# Patient Record
Sex: Female | Born: 1975 | Race: White | Hispanic: No | Marital: Married | Smoking: Former smoker
Health system: Southern US, Community
[De-identification: ages and names within clinical notes are randomized; demographics above are authoritative.]

## PROBLEM LIST (undated history)

## (undated) DIAGNOSIS — N189 Chronic kidney disease, unspecified: Secondary | ICD-10-CM

## (undated) DIAGNOSIS — T8859XA Other complications of anesthesia, initial encounter: Secondary | ICD-10-CM

## (undated) DIAGNOSIS — R51 Headache: Secondary | ICD-10-CM

## (undated) DIAGNOSIS — T4145XA Adverse effect of unspecified anesthetic, initial encounter: Secondary | ICD-10-CM

## (undated) DIAGNOSIS — R519 Headache, unspecified: Secondary | ICD-10-CM

## (undated) DIAGNOSIS — D649 Anemia, unspecified: Secondary | ICD-10-CM

## (undated) DIAGNOSIS — I499 Cardiac arrhythmia, unspecified: Secondary | ICD-10-CM

## (undated) DIAGNOSIS — Z8489 Family history of other specified conditions: Secondary | ICD-10-CM

## (undated) DIAGNOSIS — E039 Hypothyroidism, unspecified: Secondary | ICD-10-CM

## (undated) HISTORY — PX: COLONOSCOPY: SHX174

## (undated) HISTORY — PX: NASAL FRACTURE SURGERY: SHX718

## (undated) HISTORY — PX: DILATION AND CURETTAGE OF UTERUS: SHX78

## (undated) HISTORY — PX: TUBAL LIGATION: SHX77

## (undated) HISTORY — PX: EYE SURGERY: SHX253

## (undated) HISTORY — PX: ABDOMINAL HYSTERECTOMY: SHX81

---

## 2006-09-10 DIAGNOSIS — N189 Chronic kidney disease, unspecified: Secondary | ICD-10-CM

## 2006-09-10 HISTORY — DX: Chronic kidney disease, unspecified: N18.9

## 2007-05-22 ENCOUNTER — Inpatient Hospital Stay: Payer: Self-pay | Admitting: Obstetrics and Gynecology

## 2007-09-10 ENCOUNTER — Observation Stay: Payer: Self-pay

## 2007-09-10 ENCOUNTER — Observation Stay: Payer: Self-pay | Admitting: Unknown Physician Specialty

## 2007-09-13 ENCOUNTER — Inpatient Hospital Stay: Payer: Self-pay

## 2008-12-05 ENCOUNTER — Emergency Department: Payer: Self-pay | Admitting: Emergency Medicine

## 2009-04-04 ENCOUNTER — Ambulatory Visit: Payer: Self-pay | Admitting: Gastroenterology

## 2010-05-03 ENCOUNTER — Emergency Department: Payer: Self-pay | Admitting: Internal Medicine

## 2010-06-28 ENCOUNTER — Ambulatory Visit: Payer: Self-pay

## 2013-07-08 ENCOUNTER — Ambulatory Visit: Payer: Self-pay | Admitting: Obstetrics and Gynecology

## 2013-07-08 LAB — CBC
HCT: 37.4 % (ref 35.0–47.0)
MCH: 29.7 pg (ref 26.0–34.0)
MCV: 87 fL (ref 80–100)
Platelet: 240 10*3/uL (ref 150–440)
RBC: 4.31 10*6/uL (ref 3.80–5.20)
WBC: 7.8 10*3/uL (ref 3.6–11.0)

## 2013-07-14 ENCOUNTER — Ambulatory Visit: Payer: Self-pay | Admitting: Obstetrics and Gynecology

## 2013-07-15 LAB — PATHOLOGY REPORT

## 2013-11-02 ENCOUNTER — Encounter: Payer: Self-pay | Admitting: Obstetrics and Gynecology

## 2014-03-05 ENCOUNTER — Observation Stay: Payer: Self-pay | Admitting: Obstetrics and Gynecology

## 2014-03-05 LAB — PROTEIN / CREATININE RATIO, URINE
Creatinine, Urine: 45.3 mg/dL (ref 30.0–125.0)
Protein, Random Urine: 8 mg/dL (ref 0–12)
Protein/Creat. Ratio: 177 mg/gCREAT (ref 0–200)

## 2014-03-31 ENCOUNTER — Observation Stay: Payer: Self-pay | Admitting: Obstetrics and Gynecology

## 2014-03-31 LAB — URINALYSIS, COMPLETE
BILIRUBIN, UR: NEGATIVE
Blood: NEGATIVE
Glucose,UR: NEGATIVE mg/dL (ref 0–75)
LEUKOCYTE ESTERASE: NEGATIVE
Nitrite: NEGATIVE
PROTEIN: NEGATIVE
Ph: 6 (ref 4.5–8.0)
RBC,UR: 1 /HPF (ref 0–5)
SPECIFIC GRAVITY: 1.011 (ref 1.003–1.030)
Squamous Epithelial: 1
WBC UR: <1 /HPF (ref 0–5)

## 2014-03-31 LAB — FETAL FIBRONECTIN
Appearance: NORMAL
Fetal Fibronectin: NEGATIVE

## 2014-05-15 ENCOUNTER — Observation Stay: Payer: Self-pay | Admitting: Obstetrics and Gynecology

## 2014-06-03 ENCOUNTER — Inpatient Hospital Stay: Payer: Self-pay | Admitting: Obstetrics and Gynecology

## 2014-06-03 LAB — CBC WITH DIFFERENTIAL/PLATELET
BASOS ABS: 0 10*3/uL (ref 0.0–0.1)
BASOS PCT: 0.4 %
Eosinophil #: 0.1 10*3/uL (ref 0.0–0.7)
Eosinophil %: 0.5 %
HCT: 33.8 % — ABNORMAL LOW (ref 35.0–47.0)
HGB: 11.2 g/dL — AB (ref 12.0–16.0)
LYMPHS ABS: 2.4 10*3/uL (ref 1.0–3.6)
Lymphocyte %: 20.6 %
MCH: 27.1 pg (ref 26.0–34.0)
MCHC: 33.1 g/dL (ref 32.0–36.0)
MCV: 82 fL (ref 80–100)
MONO ABS: 0.8 x10 3/mm (ref 0.2–0.9)
MONOS PCT: 6.5 %
NEUTROS PCT: 72 %
Neutrophil #: 8.5 10*3/uL — ABNORMAL HIGH (ref 1.4–6.5)
PLATELETS: 181 10*3/uL (ref 150–440)
RBC: 4.12 10*6/uL (ref 3.80–5.20)
RDW: 15.1 % — ABNORMAL HIGH (ref 11.5–14.5)
WBC: 11.8 10*3/uL — ABNORMAL HIGH (ref 3.6–11.0)

## 2014-06-04 LAB — PATHOLOGY REPORT

## 2014-06-04 LAB — HEMATOCRIT: HCT: 27.3 % — AB (ref 35.0–47.0)

## 2014-12-31 NOTE — Op Note (Signed)
PATIENT NAMGarret Reddish:  Little, Lauren MR#:  [id number] DATE OF BIRTH:  [birth year]  DATE OF PROCEDURE:  07/14/2013  PREOPERATIVE DIAGNOSES: Missed abortion at 6 weeks 4 days' gestation.   POSTOPERATIVE DIAGNOSES: Missed abortion at 6 weeks 4 days' gestation.   PROCEDURE: Suction dilatation and curettage.   ANESTHESIA USED: General.   PRIMARY SURGEON: Florina OuAndreas M. Bonney AidStaebler, MD   ESTIMATED BLOOD LOSS: 100 mL.   OPERATIVE FLUIDS: 500 mL.   URINE OUTPUT: 100 mL.   COMPLICATIONS: None.   FINDINGS: Moderate amount of tissue. Cervix already dilated to fingertip. Good uterine cry noted on sharp curettage. Following the procedure, cervix and tenaculum sites hemostatic.   SPECIMENS REMOVED: Products of conception.   PATIENT CONDITION FOLLOWING PROCEDURE: Stable.   PROCEDURE IN DETAIL: Risks, benefits, and alternatives of the procedure were discussed with the patient prior to proceeding to the operating room. The patient was taken to the operating room where she was placed under general anesthesia using LMA airway. The patient was then positioned in the dorsal lithotomy position using Allen stirrups, prepped and draped in the usual sterile fashion. A timeout was performed.   Attention was turned to the patient's pelvis. The bladder was straight cathed with a red rubber catheter for 100 mL of clear urine. An operative speculum was placed to allow visualization of the cervix. The anterior lip of the cervix was grasped with a single-tooth tenaculum. The cervix was sequentially dilated using Hegar dilators, and a size 8 suction curette was then introduced into the uterus which was sounded to 10 cm. Several passes with the suction curette yielded a moderate amount of tissue. Sharp curettage at the conclusion of the case revealed good uterine cry throughout. A final pass with the suction curette was undertaken. The single-tooth tenaculum was removed. The cervix and tenaculum sites were inspected and noted to be  hemostatic. Sponge, needle, and instrument counts were correct x2. The patient tolerated the procedure well and was taken to the recovery room in stable condition.    ____________________________ Florina OuAndreas M. Bonney AidStaebler, MD ams:np D: 07/14/2013 14:55:28 ET T: 07/14/2013 15:23:25 ET JOB#: [id number]  cc: Florina OuAndreas M. Bonney AidStaebler, MD, <Dictator> Carmel SacramentoANDREAS Cathrine MusterM Kaly Mcquary MD ELECTRONICALLY SIGNED 07/22/2013 8:57

## 2015-01-01 NOTE — Consult Note (Signed)
Referral Information:  Reason for Referral 39yo G5P1031 at [redacted]w[redacted]d by US performed at [facility] (per patient report) not c/w LMP presents for consult due to her history of 2 first trimester miscarriages, MTHFR compound heterozygosity, hypothyroidism and advanced maternal age.   Referring Physician [facility]   Prenatal Hx Patient had 1 period which was NOT typical in December, 1 month after a D&C for a missed abortion.  She found out she was pregnant in January and had an ultrasound last week which confirmed viability.  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  Other labs were normal.   Past Obstetrical Hx 1.  1996 elective TAB at 9-10 weeks 2.  2009 NVD (vacuum assist with episiotomy and 3rd degree laceration) of 8#1oz female; pregnancy complicated by kidney stones 3.  May 2014 first trimester SAB (no D&C) 4.  November 2014 first trimester SAB (D&C) 5.  2015 current   Home Medications: Medication Instructions Status  levothyroxine 88 mcg (0.088 mg) oral capsule 1 cap(s) orally once a day Active  Diclegis 10 mg-10 mg oral delayed release tablet 1 tab(s) orally once a day (at bedtime), As Needed - for Nausea, Vomiting Active  Prenatal Multivitamins Prenatal Multivitamins oral tablet 1 tab(s) orally once a day Active  folic acid 3 milligram(s) orally once a day Active  Tylenol 500 mg oral tablet 2 tab(s) orally every 6 hours, As Needed - for Pain Active   Allergies:   Imitrex: SOB  Cefdinir: Hives  Vital Signs/Notes:  Nursing Vital Signs: **Vital Signs.:   23-Feb-15 09:59  Vital Signs Type Routine  Temperature Temperature (F) 96.1  Celsius 35.6  Temperature Source oral  Pulse Pulse 69  Respirations Respirations 18  Systolic BP Systolic BP 110  Diastolic BP (mmHg) Diastolic BP (mmHg)  54  Mean BP 72  Pulse Ox % Pulse Ox % 100  Pulse Ox Activity Level  At rest  Oxygen Delivery Room Air/ 21 %   Perinatal Consult:  LMP 22-Aug-2013   PGyn Hx History of irregular menses; one abnormal PAP (no therapy); no STDs   Past Medical History cont'd 1.  Hypothyroidism (had radioactive iodine therapy in 2003 for hyperthyroidism) Had a recent increase of her levothyroxine to 88 mcg daily due to an elevation in her TSH.  She has an endocrinologist at [facility] (Dr. Tedd Sias) who follows her for this. 2.  Menstrual migraines 3.  History of postpartum depression after the delivery of her daughter - was on Zoloft for 6 months.  Describes that delivery and the recovery as very traumatic. 4.  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  AMA 2.  Hypothyroidism 3.  2 first trimester losses and  compound heterozygous for the MTHFR mutation 4.  History of traumatic delivery and postpartum depression   Recommendations 1.  AMA.  We discussed the availability of genetic counseling and briefly reviewed aneuploidy screening and testing options both in the first and second trimester.  She states that results, even if abnormal, would not affect her decision to continue the pregnancy although she was considering cell free fetal DNA as she works at [employer] and that is available through them.  She ultimately elected to talk further about this with her husband. 2.  Hypothyroidism - currently being treated and monitored by endocrine.  Recommend third trimester ultrasound for growth. 3.  Compound heterozygous for MTHFR mutation - not known to be associated with pregnancy loss but can affect metabolism of homocysteine.  The only recommendation is for an additional 1 mg of folic acid.  We discussed that she screened negative for the most common thrombophilias that can be associated with 1st trimester losses (LAC, ACL, beta-2-microglobulin).  We discussed the option of completing the thrombophilia panel (for antithrombin III gene and the prothrombin gene mutation) but that this was unlikely to provide additional information in the absence of a personal history of clots.  Should she have another miscarriage, we discussed sending tissue for karyotype, parental karyotype and SIS to complete the pregnancy loss evaluation.  The patient does not meet criteria for ASA therapy according to the most current [place] Preventative Services Taskforce recommendations. 4.  History of difficult delivery/postpartum recovery and postpartum depression.  We discussed that she is at increased risk for recurrent postpartum depression but also discussed that the difficult delivery along with being a first time mother, may have compounded her risk for depression.  We discussed signs and symptoms and she is aware of the availability of treatment  should she feel it to be necessary.  We would be more than happy to see the patient back in consult or for ultrasound or genetic counseling if desired.    Total Time Spent with Patient 45 minutes   >50% of visit spent in couseling/coordination of care yes   Office Use Only [id number]  Level 3 (40min) NEW office consult detailed   Coding Description: MATERNAL CONDITIONS/HISTORY INDICATION(S).   Adv. Maternal Age - multigravida.   Coagulation defects and/or Pt on heparin/coumadin/lovenox.   Thyroid dysfunction in pregnancy.  Electronic Signatures: Kirby FunkEllestad, Haylyn Halberg (MD)  (Signed 23-Feb-15 12:29)  Authored: Referral, Home Medications, Allergies, Vital Signs/Notes, Consult, Exam, Lab/Radiology Notes, Impression, Billing, Coding Description   Last Updated: 23-Feb-15 12:29 by Kirby FunkEllestad, Klever Twyford (MD)

## 2015-01-01 NOTE — Op Note (Signed)
PATIENT NAMGarret Little:  Stager, Annastyn MR#:  [id number] DATE OF BIRTH:  1976-05-05  DATE OF PROCEDURE:  06/03/2014  PREOPERATIVE DIAGNOSES: Term intrauterine pregnancy, prior history of birth trauma desire for permanent sterility.  POSTOPERATIVE DIAGNOSES: Term intrauterine pregnancy, prior history of birth trauma desire for permanent sterility.  PROCEDURES: Low transverse cesarean section, bilateral tubal ligation, placement of On-Q pain pump.   SURGEON: Dierdre Searles. Paul Ovie Eastep, MD  ASSISTANT: Farrel ConnersGutierrez, Colleen CNM  ANESTHESIA: Spinal.   ESTIMATED BLOOD LOSS: 5 mL.   COMPLICATIONS: None.   FINDINGS: Normal tubes, ovaries and uterus. Viable female infant weighing 9 lbs. and 1 oz with Apgar scores 8, 9 at 5 minutes respectively was delivered with disposition to the recovery room in stable condition.   TECHNIQUE: The patient is prepped and draped in the usual sterile fashion. After adequate anesthesia is obtained in the supine position on the operating room table. A scalpel was used to create a low-transverse skin incision down to the level of the rectus fascia which dissected bilaterally using Mayo scissors. Rectus muscles were then separated in the midline and dissected away from the fascia and then the bladder is inferiorly dissected and retracted. A scalpel was used to create a low-transverse hysterotomy incision that was then extended by blunt dissection. Amniotomy reveals clear fluid. The infant's head is grasped and delivered without the use of a suction device. The oropharynx is suctioned and the remaining portion of the infant is delivered without complication.   Cord blood was obtained the placenta is manually extracted. The uterus is externalized and cleansed of all membranes and debris using a moist sponge. The hysterotomy incision is closed with a running 1 Vicryl suture in a locking fashion followed by a secondary imbricated with excellent hemostasis noted.   The right fallopian tube is grasped and a  portion of the Babcock clamp and a loop was tied with 2 Vicryl sutures, excised and cauterized. The left fallopian tube was grasped in midportion with Babcock clamp and a loop is tied with  2 Vicryl sutures, excised and cauterized. The uterus was then placed back into the intraabdominal cavity with the pericardium was irrigated using warm saline. Re-examination of the incisions tubal site and hysterotomy site reveals excellent hemostasis.   The peritoneum was closed with 0 Vicryl suture. The trocars are placed through the abdomen into the subfascial space and are used to thread Silver soaker catheters associated with the On-Q pain pump into place. The rectus fascia is closed with 0 Maxon suture with careful placement not to incorporate these catheters. The subcutaneous layer was irrigated and hemostasis is assured using electrocautery. The skin is closed with a 4-0 Vicryl suture in a subcuticular fashion followed placement of Dermabond. The On-Q pain pump catheters are flushed with 5 mL each of bupivacaine and  then stabilized in place with Dermabond, Steri-Strips and a Tegaderm bandage. The patient went to the recovery room in stable condition. All sponge, instrument and needle counts were correct.     ____________________________ R. Annamarie MajorPaul Felipa Laroche, MD rph:JT D: 06/03/2014 02:56:00 ET T: 06/03/2014 03:30:25 ET JOB#: [id number]  cc: Dierdre Searles. Paul Brooklin Rieger, MD, <Dictator> Nadara MustardOBERT P Twyla Dais MD ELECTRONICALLY SIGNED 06/04/2014 10:40

## 2015-01-01 NOTE — Op Note (Signed)
PATIENT NAMGarret Little:  Emmitt, Landa MR#:  [id number] DATE OF BIRTH:  December 07, 1975  DATE OF PROCEDURE:  06/03/2014  PREOPERATIVE DIAGNOSES: Term intrauterine pregnancy, prior history of birth trauma desire for permanent sterility.  POSTOPERATIVE DIAGNOSES: Term intrauterine pregnancy, prior history of birth trauma desire for permanent sterility.  PROCEDURES: Low transverse cesarean section, bilateral tubal ligation, placement of On-Q pain pump.   SURGEON: (Dictation Anomaly)  ASSISTANT: (Dictation Anomaly)   ANESTHESIA: Spinal.   ESTIMATED BLOOD LOSS: 5 mL.   COMPLICATIONS: None.   FINDINGS: Normal tubes, ovaries and uterus. Viable female infant weighing 9 lbs. and 1 oz with Apgar scores 8, 9 at 5 minutes respectively was delivered with disposition to the recovery room in stable condition.   TECHNIQUE: The patient is prepped and draped in the usual sterile fashion. After adequate anesthesia is obtained in the supine position on the operating room table. A scalpel was used to create a low-transverse skin incision down to the level of the rectus fascia which dissected bilaterally using Mayo scissors. Rectus muscles were then separated in the midline and dissected away from the fascia and then the bladder is inferiorly dissected and retracted. A scalpel was used to create a low-transverse hysterotomy incision that was then extended by blunt dissection. Amniotomy reveals clear fluid. The infant's head is grasped and delivered without the use of a suction device. The oropharynx is suctioned and the remaining portion of the infant is delivered without complication.   Cord blood was obtained the placenta is manually extracted. The uterus is externalized and cleansed of all membranes and debris using a moist sponge. The hysterotomy incision is closed with a running 1 Vicryl suture in a locking fashion followed by a secondary imbricated with excellent hemostasis noted.   The right fallopian tube is grasped and a  portion of the Babcock clamp and a loop was tied with 2 Vicryl sutures, excised and cauterized. The left fallopian tube was grasped in midportion with Babcock clamp and a loop is tied with  2 Vicryl sutures, excised and cauterized. The uterus was then placed back into the intraabdominal cavity with the pericardium was irrigated using warm saline. Re-examination of the incisions tubal site and hysterotomy site reveals excellent hemostasis.   The peritoneum was closed with 0 Vicryl suture. The trocars are placed through the abdomen into the subfascial space and are used to thread Silver soaker catheters associated with the On-Q pain pump into place. The rectus fascia is closed with 0 Maxon suture with careful placement not to incorporate these catheters. The subcutaneous layer was irrigated and hemostasis is assured using electrocautery. The skin is closed with a 4-0 Vicryl suture in a subcuticular fashion followed placement of Dermabond. The On-Q pain pump catheters are flushed with 5 mL each of bupivacaine and  then stabilized in place with Dermabond, Steri-Strips and a Tegaderm bandage. The patient went to the recovery room in stable condition. All sponge, instrument and (Dictation Anomaly) counts were correct.     ____________________________ Lenard GallowayPrasad P. Steward RosManne, MD ppm:JT D: 06/03/2014 02:56:27 ET T: 06/03/2014 03:30:25 ET JOB#: [id number]  cc: Elpidio AnisPrasad P. Steward RosManne, MD, <Dictator>

## 2015-01-18 NOTE — H&P (Signed)
L&D Evaluation:  History:  HPI 39 yo [id number] with EDC=06/06/2014 by 6week ultrasound presents at 3819w4d weeks gestation with c/o strong frrequent contractions since . She last ate at 1930-2000 last night. No LOF, VB or decreased FM. She is scheduled for a C-section on 9/25 for hx of traumatic delivery with first baby sustaining a third degree laceration. She also desires a TL and has been counseled on permanance and failure rate.  [facility] has been complicated by AMA, hypothyroidism ([facility]), history of 3rd degree perineal laceration and several episodes of tachycardia/blurred vision. Pt was evaluated by Cardiology, per pt holter monitor and echo were normal. [facility] d/t h/o 3rd degree- anal US normal, Rec's in chart (avoid post-dates IOL, episiotomy and operative delivery)   Presents with contractions   Patient's Medical History 1) hypothyroidism, 2) migraine, 3) PCOS 4) PP Depression   Patient's Surgical History other  1) colonoscopy, 2) D&C for missed AB, 3) Tear duct surgery at 6 mos of age   Medications Pre [place]atal Vitamins  Tylenol (Acetaminophen)  levothyroxine 100mcg po daily (was advised to take 200 mcg on 9/23 as TSH slightly elevated), and Ambien prn   Allergies 1) imitrex - chest/shoulder/head tingling, 2) cefnidir - hives   Social History none   Family History Non-Contributory   ROS:  ROS see HPI   Exam:  Vital Signs stable  138/72   General breathing with contractions   Mental Status clear   Chest clear   Heart normal sinus rhythm, no murmur/gallop/rubs   Abdomen gravid, tender with contractions   Estimated Fetal Weight EFW=8+   Fetal Position cephalic   Edema trace edema   Reflexes +1-+2   Pelvic no external lesions, 4/90%/-1   Mebranes Intact   FHT normal rate with no decels, 130s baseline with accels to 160s   FHT Description Cat 1   Ucx regular, q1-4 min apart    Skin dry   Impression:  Impression IUP at 39 4/7 weeks in early labor. Desires CS for hx of traumatic delivery with third degree laceration.  Desires sterilization   Plan:  Plan Prepare for C-section and TL. Dr Tiburcio PeaHarris consulted -CS called.   Comments Discussed with patient risks of C-section including risks of bleeding, infection, injury to other abdominal structures like bowel, bladder or other blood vessels and the risks of anesthesia. She has also been counseled on the permanance of a tubal ligation and risks of failure of 1/300. Blood transfusion consent also obtained-reviewed risks of infection and allergic rxn.   Electronic Signatures: Trinna BalloonGutierrez, Angle Karel L (CNM)  (Signed 24-Sep-15 01:16)  Authored: L&D Evaluation   Last Updated: 24-Sep-15 01:16 by Trinna BalloonGutierrez, Nevaeh Casillas L (CNM)

## 2015-01-18 NOTE — H&P (Signed)
L&D Evaluation:  History Expanded:  HPI 39 yo G4P1021 at 2854w3d gestational age by 6 weeks ultrasound, presents with c/o low abdominal pain since yesterday, approximately every 6-7 minutes. She left work early yesterday d/t not feeling well. She reports good po hydration. No LOF, VB or decreased FM.   [facility] complicated by AMA, hypothyroidism ([facility] Lauren [facility]), history of 3rd degree perineal laceration and several episodes of tachycardia/blurred vision. Pt was evaluated by Cardiology, per pt holter monitor and echo were normal. [facility]/t h/o 3rd degree- anal US normal, Rec's in chart (avoid post-dates IOL, episiotomy and operative delivery)   Gravida 4   Term 1   PreTerm 0   Abortion 2   Living 1   Blood Type (Maternal) A positive   Group B Strep Results Maternal (Result >5wks must be treated as unknown) unknown/result > 5 weeks ago   Maternal HIV Negative   Maternal Syphilis Ab Nonreactive   Maternal Varicella Immune   Rubella Results (Maternal) immune   EDC 06-Jun-2014   Patient's Medical History 1) hypothyroidism, 2) migraine, 3) PCOS 4) PP Depression   Patient's Surgical History 1) colonoscopy, 2) D&C for missed AB, 3) Tear duct surgery at 6 mos of age   Medications Pre [place]atal Vitamins  levothyroxine 100mcg po daily   Allergies 1) imitrex - chest/shoulder/head tingling, 2) cefnidir - hives   Social History none   Family History Non-Contributory   ROS:  ROS All systems were reviewed.  HEENT, CNS, GI, GU, Respiratory, CV, Renal and Musculoskeletal systems were found to be normal.   Exam:  Vital Signs stable   Urine Protein trace   General no apparent distress   Mental Status clear   Chest clear   Heart normal sinus rhythm   Abdomen gravid, NT on palpation   Back no CVAT   Edema no edema   Reflexes 2+   Pelvic no external lesions, cervix closed and thick, external os:  fingertip   Mebranes Intact   FHT normal rate with no decels, baseline 145, mod variability, + 15x15 accels, no decels, unchanged x 1 hour   Ucx irregular, mild contractions q 5-5310minutes   Impression:  Impression IUP at 254w3d with c/o contractions   Plan:  Plan UA, monitor contractions and for cervical change   Comments FFN   Electronic Signatures: [name], Lauren Little (CNM)  (Signed 22-Jul-15 16:10)  Authored: L&D Evaluation   Last Updated: 22-Jul-15 16:10 by [name], Lauren Little (CNM)

## 2015-01-18 NOTE — H&P (Signed)
L&D Evaluation:  History Expanded:  HPI 39 yo G4P1021 at 2271w5d gestational age by 6 weeks ultrasound.  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   She presents with a 1.5 week history of "feeling weird." She states that she has had felt heavy in the chest, flushed and hot, a little shaky, and tired.  In the past three days she has been feeling worse.  She specifically notes an increase in work of breathing and pounding in her ears.  She started taking her blood pressures yesterday and had an initially elevated BP of 151/71, then 143/66.  The rest of her BPs yesterday were normal.  She had an elevated BP today of 142/62 and otherwise normal BPs. Her pulse at the same time was in the 80s-90s. Today she woke up with a headache which was relieved by Tylenol.  She overall feels very tired.   Gravida 4   Term 1   PreTerm 0   Abortion 2   Living 1   Blood Type (Maternal) A positive   Group B Strep Results Maternal (Result >5wks must be treated as unknown) unknown/result > 5 weeks ago   Maternal HIV Negative   Maternal Syphilis Ab Nonreactive   Maternal Varicella Immune   Rubella Results (Maternal) immune   EDC 06-Jun-2014   Patient's Medical History 1) hypothyroidism, 2) migraine, 3) PCOS   Patient's Surgical History 1) colonoscopy, 2) D&C for missed AB, 3) Tear duct surgery at 6 mos of age   Medications Pre [place]atal Vitamins  levothyroxine 100mcg po daily   Allergies 1) imitrex - chest/shoulder/head tingling, 2) cefnidir - hives   Social History none   Family History Non-Contributory   ROS:  ROS All systems were reviewed.  HEENT, CNS, GI, GU, Respiratory, CV, Renal and Musculoskeletal systems were found to be normal.   Exam:  Vital Signs T97.8, BP 101-134/57-75, P 74-97, O2 sats 99%RA   Urine Protein trace   General no apparent distress   Mental  Status clear   Chest clear   Heart normal sinus rhythm   Abdomen gravid, non-tender   Back no CVAT   Edema no edema   Reflexes 2+   FHT normal rate with no decels   FHT Description 140/mod var/+accels/no decels   Other EKG: (read by Dr. Terrilee FilesM Arida) w/ non-speficif ST changes, Borderline ECG (official read to occur in 3 days) BG 77 mg/dL   Impression:  Impression 1) Intrauterine pregnancy at 6271w5d gestational age, 2) non-specific constellation of symptoms   Plan:  Plan EFM/NST, monitor BP   Comments Reassure pt today. She has had some elevated BPs at home per her home machine.  None noted here today.  PC Ratio normal today, though elevated.  Worth repeating, but this will serve as baseline.Normal blood glucose, EKG not specific, but unlikely to explain her symptoms.  Possible thyroid issue to suggest symptoms. But, patient states that she can tell when her thyroid levels are not normal. Her next appt with endocrine is in August.  Recommend repeating thyroid panel with 28 weeks labs, as it is recommended to be checked in 3rd trimester.  This would allow for earlier check than August.Continue to follow her closely for either resolution of her symptoms or worsening of them   Keep appt this coming week.   Follow Up Appointment already scheduled   Labs:  Lab Results: Misc Urine Chem:  26-Jun-15 17:14   Protein/Creat Ratio (comp) 177 (Result(s) reported on 05 Mar 2014 at 06:39PM.)   Electronic Signatures: Conard NovakJackson, Roshaunda Starkey D (MD)  (Signed 26-Jun-15 19:04)  Authored: L&D Evaluation, Labs   Last Updated: 26-Jun-15 19:04 by Conard NovakJackson, Toini Failla D (MD)

## 2015-01-18 NOTE — H&P (Signed)
L&D Evaluation:  History:  HPI CC: lost mucus plug and UCs q5018m HPI: 39 y/o [id number] @ 36/6, with the above CC. Preg c/b AMA (s/p normal cffdna), hypothy (on synthroid), mthfr mutation, abnormal 1hr with normal 3hr (@ 36/3 EFW and AC were >90%) and h/o 3rd degree.  Patient states s/s have been persisting for a few days and when she lost her mucus plug she decided to come in for evaluation.  She was seen earlier in clinic and was 1cm on 9/2 No VB (some spotting with wiping), LOF or decreased FM   Exam:  Vital Signs AF VS normal and stable   General no apparent distress   Mental Status clear   Chest ctab   Heart rrr no mrgs   Abdomen gravid, non-tender   Pelvic 1/50 per RN   Mebranes Intact   FHT 135 baseline, +accels, no decels, mod var   Ucx q5-1758m   Impression:  Impression negative PTL evaluation   Plan:  Comments reassuring fetal status. PTL precautions given. continue current prenatal plan of care  told to keep upcoming wednesday appt   Follow Up Appointment already scheduled   Electronic Signatures: Clayton BingPickens, Leafy Motsinger (MD)  (Signed 05-Sep-15 23:12)  Authored: L&D Evaluation   Last Updated: 05-Sep-15 23:12 by [place] BingPickens, Latrisha Coiro (MD)

## 2015-10-07 ENCOUNTER — Other Ambulatory Visit: Payer: Self-pay

## 2015-10-10 ENCOUNTER — Other Ambulatory Visit: Payer: Self-pay

## 2015-10-10 ENCOUNTER — Encounter: Payer: Self-pay | Admitting: *Deleted

## 2015-10-10 MED ORDER — MOXIFLOXACIN HCL 0.5 % OP SOLN
OPHTHALMIC | Status: AC
  Filled 2015-10-10: qty 3

## 2015-10-10 NOTE — Patient Instructions (Signed)
  Your procedure is scheduled on: 10-13-15 (THURSDAY) Report to [facility] SAME DAY SURGERY 2ND FLOOR To find out your arrival time please call [phone] between 1PM - 3PM on 10-12-15 [facility])  Remember: Instructions that are not followed completely may result in serious medical risk, up to and including death, or upon the discretion of your surgeon and anesthesiologist your surgery may need to be rescheduled.    _X__ 1. Do not eat food or drink liquids after midnight. No gum chewing or hard candies.     _X__ 2. No Alcohol for 24 hours before or after surgery.   ____ 3. Bring all medications with you on the day of surgery if instructed.    _X__ 4. Notify your doctor if there is any change in your medical condition     (cold, fever, infections).     Do not wear jewelry, make-up, hairpins, clips or nail polish.  Do not wear lotions, powders, or perfumes. You may wear deodorant.  Do not shave 48 hours prior to surgery. Men may shave face and neck.  Do not bring valuables to the hospital.    [facility] is not responsible for any belongings or valuables.               Contacts, dentures or bridgework may not be worn into surgery.  Leave your suitcase in the car. After surgery it may be brought to your room.  For patients admitted to the hospital, discharge time is determined by your treatment team.   Patients discharged the day of surgery will not be allowed to drive home.   Please read over the following fact sheets that you were given:     _X__ Take these medicines the morning of surgery with A SIP OF WATER:    1. LEVOTHYROXINE  2.   3.   4.  5.  6.  ____ Fleet Enema (as directed)   ____ Use CHG Soap as directed  ____ Use inhalers on the day of surgery  ____ Stop metformin 2 days prior to surgery    ____ Take 1/2 of usual insulin dose the night before surgery and none on the morning of surgery.   ____ Stop Coumadin/Plavix/aspirin-N/A  ____ Stop  Anti-inflammatories-NO NSAIDS OR ASPIRIN PRODUCTS-TYLENOL OK  TO TAKE   ____ Stop supplements until after surgery.    ____ Bring C-Pap to the hospital.

## 2015-10-11 ENCOUNTER — Encounter
Admission: RE | Admit: 2015-10-11 | Discharge: 2015-10-11 | Disposition: A | Discharge status: Home / Self Care | Payer: 59 | Source: Ambulatory Visit | Attending: Obstetrics & Gynecology | Admitting: Obstetrics & Gynecology

## 2015-10-11 DIAGNOSIS — Z9889 Other specified postprocedural states: Secondary | ICD-10-CM | POA: Diagnosis not present

## 2015-10-11 DIAGNOSIS — Z79899 Other long term (current) drug therapy: Secondary | ICD-10-CM | POA: Diagnosis not present

## 2015-10-11 DIAGNOSIS — Z881 Allergy status to other antibiotic agents status: Secondary | ICD-10-CM | POA: Diagnosis not present

## 2015-10-11 DIAGNOSIS — N920 Excessive and frequent menstruation with regular cycle: Secondary | ICD-10-CM | POA: Diagnosis not present

## 2015-10-11 DIAGNOSIS — Z79891 Long term (current) use of opiate analgesic: Secondary | ICD-10-CM | POA: Diagnosis not present

## 2015-10-11 DIAGNOSIS — N711 Chronic inflammatory disease of uterus: Secondary | ICD-10-CM | POA: Diagnosis not present

## 2015-10-11 DIAGNOSIS — N72 Inflammatory disease of cervix uteri: Secondary | ICD-10-CM | POA: Diagnosis not present

## 2015-10-11 DIAGNOSIS — Z87442 Personal history of urinary calculi: Secondary | ICD-10-CM | POA: Diagnosis not present

## 2015-10-11 DIAGNOSIS — Z882 Allergy status to sulfonamides status: Secondary | ICD-10-CM | POA: Diagnosis not present

## 2015-10-11 DIAGNOSIS — E039 Hypothyroidism, unspecified: Secondary | ICD-10-CM | POA: Diagnosis not present

## 2015-10-11 DIAGNOSIS — Z791 Long term (current) use of non-steroidal anti-inflammatories (NSAID): Secondary | ICD-10-CM | POA: Diagnosis not present

## 2015-10-11 DIAGNOSIS — N838 Other noninflammatory disorders of ovary, fallopian tube and broad ligament: Secondary | ICD-10-CM | POA: Diagnosis not present

## 2015-10-11 DIAGNOSIS — N189 Chronic kidney disease, unspecified: Secondary | ICD-10-CM | POA: Diagnosis not present

## 2015-10-11 DIAGNOSIS — Z91030 Bee allergy status: Secondary | ICD-10-CM | POA: Diagnosis not present

## 2015-10-11 LAB — ABO/RH: ABO/RH(D): A POS

## 2015-10-11 LAB — TYPE AND SCREEN
ABO/RH(D): A POS
ANTIBODY SCREEN: NEGATIVE

## 2015-10-13 ENCOUNTER — Encounter: Payer: Self-pay | Admitting: *Deleted

## 2015-10-13 ENCOUNTER — Ambulatory Visit: Payer: 59 | Admitting: Registered Nurse

## 2015-10-13 ENCOUNTER — Ambulatory Visit
Admission: RE | Admit: 2015-10-13 | Discharge: 2015-10-13 | Disposition: A | Discharge status: Home / Self Care | Payer: 59 | Source: Ambulatory Visit | Attending: Obstetrics & Gynecology | Admitting: Obstetrics & Gynecology

## 2015-10-13 ENCOUNTER — Encounter
Admission: RE | Disposition: A | Discharge status: Home / Self Care | Payer: Self-pay | Source: Ambulatory Visit | Attending: Obstetrics & Gynecology

## 2015-10-13 DIAGNOSIS — Z79891 Long term (current) use of opiate analgesic: Secondary | ICD-10-CM | POA: Insufficient documentation

## 2015-10-13 DIAGNOSIS — Z882 Allergy status to sulfonamides status: Secondary | ICD-10-CM | POA: Insufficient documentation

## 2015-10-13 DIAGNOSIS — Z881 Allergy status to other antibiotic agents status: Secondary | ICD-10-CM | POA: Insufficient documentation

## 2015-10-13 DIAGNOSIS — Z9889 Other specified postprocedural states: Secondary | ICD-10-CM | POA: Insufficient documentation

## 2015-10-13 DIAGNOSIS — E039 Hypothyroidism, unspecified: Secondary | ICD-10-CM | POA: Insufficient documentation

## 2015-10-13 DIAGNOSIS — Z791 Long term (current) use of non-steroidal anti-inflammatories (NSAID): Secondary | ICD-10-CM | POA: Insufficient documentation

## 2015-10-13 DIAGNOSIS — N189 Chronic kidney disease, unspecified: Secondary | ICD-10-CM | POA: Insufficient documentation

## 2015-10-13 DIAGNOSIS — N72 Inflammatory disease of cervix uteri: Secondary | ICD-10-CM | POA: Insufficient documentation

## 2015-10-13 DIAGNOSIS — N711 Chronic inflammatory disease of uterus: Secondary | ICD-10-CM | POA: Insufficient documentation

## 2015-10-13 DIAGNOSIS — N920 Excessive and frequent menstruation with regular cycle: Secondary | ICD-10-CM | POA: Insufficient documentation

## 2015-10-13 DIAGNOSIS — Z87442 Personal history of urinary calculi: Secondary | ICD-10-CM | POA: Insufficient documentation

## 2015-10-13 DIAGNOSIS — Z91030 Bee allergy status: Secondary | ICD-10-CM | POA: Insufficient documentation

## 2015-10-13 DIAGNOSIS — N838 Other noninflammatory disorders of ovary, fallopian tube and broad ligament: Secondary | ICD-10-CM | POA: Insufficient documentation

## 2015-10-13 DIAGNOSIS — Z79899 Other long term (current) drug therapy: Secondary | ICD-10-CM | POA: Insufficient documentation

## 2015-10-13 HISTORY — DX: Headache: R51

## 2015-10-13 HISTORY — DX: Hypothyroidism, unspecified: E03.9

## 2015-10-13 HISTORY — DX: Cardiac arrhythmia, unspecified: I49.9

## 2015-10-13 HISTORY — PX: LAPAROSCOPIC HYSTERECTOMY: SHX1926

## 2015-10-13 HISTORY — DX: Adverse effect of unspecified anesthetic, initial encounter: T41.45XA

## 2015-10-13 HISTORY — DX: Headache, unspecified: R51.9

## 2015-10-13 HISTORY — DX: Chronic kidney disease, unspecified: N18.9

## 2015-10-13 HISTORY — PX: LAPAROSCOPIC BILATERAL SALPINGECTOMY: SHX5889

## 2015-10-13 HISTORY — DX: Anemia, unspecified: D64.9

## 2015-10-13 HISTORY — DX: Family history of other specified conditions: Z84.89

## 2015-10-13 HISTORY — DX: Other complications of anesthesia, initial encounter: T88.59XA

## 2015-10-13 LAB — POCT PREGNANCY, URINE: PREG TEST UR: NEGATIVE

## 2015-10-13 SURGERY — HYSTERECTOMY, TOTAL, LAPAROSCOPIC
Anesthesia: General | Wound class: Clean Contaminated

## 2015-10-13 MED ORDER — GLYCOPYRROLATE 0.2 MG/ML IJ SOLN
INTRAMUSCULAR | Status: DC | PRN
  Administered 2015-10-13: 0.2 mg via INTRAVENOUS

## 2015-10-13 MED ORDER — ROCURONIUM BROMIDE 100 MG/10ML IV SOLN
INTRAVENOUS | Status: DC | PRN
  Administered 2015-10-13: 50 mg via INTRAVENOUS

## 2015-10-13 MED ORDER — FENTANYL CITRATE (PF) 100 MCG/2ML IJ SOLN
25.0000 ug | INTRAMUSCULAR | Status: AC | PRN
  Administered 2015-10-13 (×6): 25 ug via INTRAVENOUS

## 2015-10-13 MED ORDER — FENTANYL CITRATE (PF) 100 MCG/2ML IJ SOLN
25.0000 ug | INTRAMUSCULAR | Status: DC | PRN
  Filled 2015-10-13: qty 1

## 2015-10-13 MED ORDER — HYDROMORPHONE HCL 1 MG/ML IJ SOLN
INTRAMUSCULAR | Status: AC
  Filled 2015-10-13: qty 1

## 2015-10-13 MED ORDER — ACETAMINOPHEN 325 MG PO TABS: 650.0000 mg | ORAL_TABLET | ORAL | Status: DC | PRN

## 2015-10-13 MED ORDER — OXYCODONE-ACETAMINOPHEN 5-325 MG PO TABS: 1.0000 | ORAL_TABLET | ORAL | Status: DC | PRN

## 2015-10-13 MED ORDER — SODIUM CHLORIDE 0.9 % IJ SOLN
INTRAMUSCULAR | Status: AC
  Filled 2015-10-13: qty 10

## 2015-10-13 MED ORDER — FAMOTIDINE 20 MG PO TABS
ORAL_TABLET | ORAL | Status: AC
  Administered 2015-10-13: 20 mg via ORAL
  Filled 2015-10-13: qty 1

## 2015-10-13 MED ORDER — FENTANYL CITRATE (PF) 100 MCG/2ML IJ SOLN
INTRAMUSCULAR | Status: AC
  Administered 2015-10-13: 25 ug via INTRAVENOUS
  Filled 2015-10-13: qty 2

## 2015-10-13 MED ORDER — FENTANYL CITRATE (PF) 100 MCG/2ML IJ SOLN
INTRAMUSCULAR | Status: DC | PRN
  Administered 2015-10-13: 50 ug via INTRAVENOUS
  Administered 2015-10-13: 100 ug via INTRAVENOUS
  Administered 2015-10-13 (×2): 50 ug via INTRAVENOUS

## 2015-10-13 MED ORDER — ONDANSETRON HCL 4 MG/2ML IJ SOLN
INTRAMUSCULAR | Status: DC | PRN
Start: 2015-10-13 — End: 2015-10-13
  Administered 2015-10-13: 4 mg via INTRAVENOUS

## 2015-10-13 MED ORDER — BUPIVACAINE HCL (PF) 0.5 % IJ SOLN
INTRAMUSCULAR | Status: AC
  Filled 2015-10-13: qty 30

## 2015-10-13 MED ORDER — EPHEDRINE SULFATE 50 MG/ML IJ SOLN
INTRAMUSCULAR | Status: DC | PRN
  Administered 2015-10-13: 15 mg via INTRAVENOUS

## 2015-10-13 MED ORDER — LIDOCAINE HCL (CARDIAC) 20 MG/ML IV SOLN
INTRAVENOUS | Status: DC | PRN
  Administered 2015-10-13: 100 mg via INTRAVENOUS

## 2015-10-13 MED ORDER — PROPOFOL 10 MG/ML IV BOLUS
INTRAVENOUS | Status: DC | PRN
  Administered 2015-10-13: 160 mg via INTRAVENOUS

## 2015-10-13 MED ORDER — ACETAMINOPHEN 10 MG/ML IV SOLN
INTRAVENOUS | Status: DC | PRN
  Administered 2015-10-13: 1000 mg via INTRAVENOUS

## 2015-10-13 MED ORDER — SCOPOLAMINE 1 MG/3DAYS TD PT72
MEDICATED_PATCH | TRANSDERMAL | Status: AC
  Filled 2015-10-13: qty 1

## 2015-10-13 MED ORDER — LIDOCAINE HCL (PF) 1 % IJ SOLN
INTRAMUSCULAR | Status: AC
  Filled 2015-10-13: qty 2

## 2015-10-13 MED ORDER — FAMOTIDINE 20 MG PO TABS
20.0000 mg | ORAL_TABLET | Freq: Once | ORAL | Status: AC
  Administered 2015-10-13: 20 mg via ORAL

## 2015-10-13 MED ORDER — SUGAMMADEX SODIUM 200 MG/2ML IV SOLN
INTRAVENOUS | Status: DC | PRN
  Administered 2015-10-13: 200 mg via INTRAVENOUS

## 2015-10-13 MED ORDER — PROMETHAZINE HCL 25 MG/ML IJ SOLN
INTRAMUSCULAR | Status: DC
Start: 2015-10-13 — End: 2015-10-13
  Filled 2015-10-13: qty 1

## 2015-10-13 MED ORDER — HEPARIN SODIUM (PORCINE) 5000 UNIT/ML IJ SOLN
INTRAMUSCULAR | Status: AC
  Administered 2015-10-13: 5000 [IU] via SUBCUTANEOUS
  Filled 2015-10-13: qty 1

## 2015-10-13 MED ORDER — HEPARIN SODIUM (PORCINE) 5000 UNIT/ML IJ SOLN
5000.0000 [IU] | Freq: Once | INTRAMUSCULAR | Status: AC
  Administered 2015-10-13: 5000 [IU] via SUBCUTANEOUS

## 2015-10-13 MED ORDER — ACETAMINOPHEN 650 MG RE SUPP
650.0000 mg | RECTAL | Status: DC | PRN
  Filled 2015-10-13: qty 1

## 2015-10-13 MED ORDER — KETOROLAC TROMETHAMINE 60 MG/2ML IM SOLN
INTRAMUSCULAR | Status: DC
Start: 2015-10-13 — End: 2015-10-13
  Filled 2015-10-13: qty 2

## 2015-10-13 MED ORDER — HYDROMORPHONE HCL 1 MG/ML IJ SOLN
0.2500 mg | INTRAMUSCULAR | Status: DC | PRN
  Administered 2015-10-13 (×2): 0.5 mg via INTRAVENOUS

## 2015-10-13 MED ORDER — DEXAMETHASONE SODIUM PHOSPHATE 10 MG/ML IJ SOLN
INTRAMUSCULAR | Status: DC | PRN
Start: 2015-10-13 — End: 2015-10-13
  Administered 2015-10-13: 5 mg via INTRAVENOUS

## 2015-10-13 MED ORDER — CEFAZOLIN SODIUM-DEXTROSE 2-3 GM-% IV SOLR
INTRAVENOUS | Status: AC
  Administered 2015-10-13: 2 g via INTRAVENOUS
  Filled 2015-10-13: qty 50

## 2015-10-13 MED ORDER — SCOPOLAMINE 1 MG/3DAYS TD PT72
1.0000 | MEDICATED_PATCH | TRANSDERMAL | Status: DC
  Administered 2015-10-13: 1.5 mg via TRANSDERMAL

## 2015-10-13 MED ORDER — MIDAZOLAM HCL 2 MG/2ML IJ SOLN
INTRAMUSCULAR | Status: DC | PRN
  Administered 2015-10-13: 2 mg via INTRAVENOUS

## 2015-10-13 MED ORDER — IBUPROFEN 600 MG PO TABS: 600.0000 mg | ORAL_TABLET | Freq: Four times a day (QID) | ORAL | Status: DC | PRN

## 2015-10-13 MED ORDER — ONDANSETRON HCL 4 MG/2ML IJ SOLN
4.0000 mg | Freq: Once | INTRAMUSCULAR | Status: AC | PRN
  Administered 2015-10-13: 4 mg via INTRAVENOUS

## 2015-10-13 MED ORDER — ONDANSETRON HCL 4 MG/2ML IJ SOLN
INTRAMUSCULAR | Status: AC
  Filled 2015-10-13: qty 2

## 2015-10-13 MED ORDER — CEFAZOLIN SODIUM-DEXTROSE 2-3 GM-% IV SOLR
2.0000 g | Freq: Once | INTRAVENOUS | Status: AC
  Administered 2015-10-13: 2 g via INTRAVENOUS

## 2015-10-13 MED ORDER — KETOROLAC TROMETHAMINE 30 MG/ML IJ SOLN
30.0000 mg | Freq: Four times a day (QID) | INTRAMUSCULAR | Status: DC
  Administered 2015-10-13: 30 mg via INTRAVENOUS
  Filled 2015-10-13 (×5): qty 1

## 2015-10-13 MED ORDER — ACETAMINOPHEN 10 MG/ML IV SOLN
INTRAVENOUS | Status: AC
  Filled 2015-10-13: qty 100

## 2015-10-13 MED ORDER — LACTATED RINGERS IV SOLN
INTRAVENOUS | Status: DC
Start: 2015-10-13 — End: 2015-10-13
  Administered 2015-10-13: 10:00:00 via INTRAVENOUS

## 2015-10-13 SURGICAL SUPPLY — 73 items
BAG URO DRAIN 2000ML W/SPOUT (MISCELLANEOUS) ×4 IMPLANT
BLADE SURG SZ11 CARB STEEL (BLADE) ×4 IMPLANT
CANISTER SUCT 1200ML W/VALVE (MISCELLANEOUS) ×4 IMPLANT
CATH FOLEY 2WAY  5CC 16FR (CATHETERS) ×2
CATH ROBINSON RED A/P 16FR (CATHETERS) ×4 IMPLANT
CATH URTH 16FR FL 2W BLN LF (CATHETERS) ×2 IMPLANT
CHLORAPREP W/TINT 26ML (MISCELLANEOUS) ×4 IMPLANT
DEFOGGER SCOPE WARMER CLEARIFY (MISCELLANEOUS) ×4 IMPLANT
DEVICE SUTURE ENDOST 10MM (ENDOMECHANICALS) ×4 IMPLANT
DRAPE LEGGINS SURG 28X43 STRL (DRAPES) ×4 IMPLANT
DRAPE SHEET LG 3/4 BI-LAMINATE (DRAPES) ×4 IMPLANT
DRAPE UNDER BUTTOCK W/FLU (DRAPES) ×4 IMPLANT
DRESSING TELFA 4X3 1S ST N-ADH (GAUZE/BANDAGES/DRESSINGS) ×12 IMPLANT
DRSG TEGADERM 2-3/8X2-3/4 SM (GAUZE/BANDAGES/DRESSINGS) ×12 IMPLANT
ENDOPOUCH RETRIEVER 10 (MISCELLANEOUS) IMPLANT
ENDOSTITCH 0 SINGLE 48 (SUTURE) IMPLANT
GAUZE SPONGE NON-WVN 2X2 STRL (MISCELLANEOUS) ×4 IMPLANT
GLOVE BIO SURGEON STRL SZ 6.5 (GLOVE) ×6 IMPLANT
GLOVE BIO SURGEONS STRL SZ 6.5 (GLOVE) ×2
GLOVE BIOGEL M 8.0 STRL (GLOVE) ×20 IMPLANT
GLOVE BIOGEL PI IND STRL 6.5 (GLOVE) ×2 IMPLANT
GLOVE BIOGEL PI INDICATOR 6.5 (GLOVE) ×2
GLOVE INDICATOR 7.0 STRL GRN (GLOVE) ×8 IMPLANT
GLOVE SURG SYN 6.5 ES PF (GLOVE) ×20 IMPLANT
GOWN STRL REUS W/ TWL LRG LVL3 (GOWN DISPOSABLE) ×6 IMPLANT
GOWN STRL REUS W/ TWL XL LVL3 (GOWN DISPOSABLE) ×2 IMPLANT
GOWN STRL REUS W/TWL LRG LVL3 (GOWN DISPOSABLE) ×6
GOWN STRL REUS W/TWL XL LVL3 (GOWN DISPOSABLE) ×2
GRASPER SUT TROCAR 14GX15 (MISCELLANEOUS) ×4 IMPLANT
HIBICLENS CHG 4% 32OZ (MISCELLANEOUS) ×4 IMPLANT
IRRIGATION STRYKERFLOW (MISCELLANEOUS) ×2 IMPLANT
IRRIGATOR STRYKERFLOW (MISCELLANEOUS) ×4
IV LACTATED RINGERS 1000ML (IV SOLUTION) ×4 IMPLANT
KIT PINK PAD W/HEAD ARE REST (MISCELLANEOUS) ×4
KIT PINK PAD W/HEAD ARM REST (MISCELLANEOUS) ×2 IMPLANT
KIT RM TURNOVER CYSTO AR (KITS) ×4 IMPLANT
LABEL OR SOLS (LABEL) ×4 IMPLANT
LIGASURE BLUNT 5MM 37CM (INSTRUMENTS) ×4 IMPLANT
LIGASURE MARYLAND LAP STAND (ELECTROSURGICAL) IMPLANT
LIQUID BAND (GAUZE/BANDAGES/DRESSINGS) ×4 IMPLANT
MANIPULATOR UTERINE ZUMI 4.5 (MISCELLANEOUS) IMPLANT
MANIPULATOR VCARE LG CRV RETR (MISCELLANEOUS) IMPLANT
MANIPULATOR VCARE STD CRV RETR (MISCELLANEOUS) ×4 IMPLANT
NDL SAFETY 22GX1.5 (NEEDLE) ×4 IMPLANT
NEEDLE VERESS 14GA 120MM (NEEDLE) IMPLANT
NS IRRIG 500ML POUR BTL (IV SOLUTION) ×4 IMPLANT
PACK LAP CHOLECYSTECTOMY (MISCELLANEOUS) ×4 IMPLANT
PAD OB MATERNITY 4.3X12.25 (PERSONAL CARE ITEMS) ×4 IMPLANT
PAD PREP 24X41 OB/GYN DISP (PERSONAL CARE ITEMS) ×4 IMPLANT
PAD TRENDELENBURG OR TABLE (MISCELLANEOUS) ×4 IMPLANT
PENCIL ELECTRO HAND CTR (MISCELLANEOUS) ×4 IMPLANT
SCISSORS METZENBAUM CVD 33 (INSTRUMENTS) IMPLANT
SET CYSTO W/LG BORE CLAMP LF (SET/KITS/TRAYS/PACK) ×4 IMPLANT
SHEARS HARMONIC ACE PLUS 36CM (ENDOMECHANICALS) IMPLANT
SLEEVE ENDOPATH XCEL 5M (ENDOMECHANICALS) ×8 IMPLANT
SPONGE LAP 18X18 5 PK (GAUZE/BANDAGES/DRESSINGS) IMPLANT
SPONGE VERSALON 2X2 STRL (MISCELLANEOUS) ×4
SURGILUBE 2OZ TUBE FLIPTOP (MISCELLANEOUS) ×4 IMPLANT
SUT ENDO VLOC 180-0-8IN (SUTURE) ×4 IMPLANT
SUT MNCRL 4-0 (SUTURE) ×2
SUT MNCRL 4-0 27XMFL (SUTURE) ×2
SUT MNCRL AB 4-0 PS2 18 (SUTURE) ×4 IMPLANT
SUT VIC AB 0 CT1 36 (SUTURE) ×16 IMPLANT
SUT VIC AB 2-0 UR6 27 (SUTURE) ×4 IMPLANT
SUT VIC AB 4-0 PS2 18 (SUTURE) ×4 IMPLANT
SUTURE MNCRL 4-0 27XMF (SUTURE) ×2 IMPLANT
SYR 50ML LL SCALE MARK (SYRINGE) ×4 IMPLANT
SYRINGE 10CC LL (SYRINGE) ×8 IMPLANT
TROCAR BLUNT TIP 12MM OMST12BT (TROCAR) ×4 IMPLANT
TROCAR ENDO BLADELESS 11MM (ENDOMECHANICALS) ×4 IMPLANT
TROCAR XCEL NON-BLD 5MMX100MML (ENDOMECHANICALS) ×4 IMPLANT
TUBING INSUFFLATOR HEATED (MISCELLANEOUS) ×4 IMPLANT
TUBING INSUFFLATOR HI FLOW (MISCELLANEOUS) ×4 IMPLANT

## 2015-10-13 NOTE — OR Nursing (Signed)
Dr. Elesa Massed here.  Ordered scopolamine for nausea

## 2015-10-13 NOTE — H&P (Signed)
H&P Update  PLEASE SEE PAPER H&P  Pt was last seen in my office, and complete history and physical performed.  The surgical history has been reviewed and remains accurate without interval change. The patient was re-examined and patient's physiologic condition has not changed significantly in the last 30 days.  No new pharmacological allergies or types of therapy has been initiated.  Allergies  Allergen Reactions  . Bee Venom Shortness Of Breath  . Sulfa Antibiotics Hives and Shortness Of Breath  . Cefdinir Rash and Other (See Comments)    BLOATING    Past Medical History  Diagnosis Date  . Hypothyroidism   . Chronic kidney disease 2008    H/O KIDNEY STONES   . Headache     MIGRAINES  . Anemia     H/O  . Complication of anesthesia     TAKES MORE TO GET PT SEDATED  . Family history of adverse reaction to anesthesia     DAD-UNSURE OF WHAT REACTION HE HAD TO ANESTHESIA  . Dysrhythmia     H/O TACHYCARDIA IN 2015 DURING PREGNANCY-SAW DR Alcide Clever AND HAD ECHO DONE (NORMAL) AND HOLTER MONITOR SHOWED PVC'S-NO PROBLEMS SINCE HER PREGNANCY (10-10-15)   Past Surgical History  Procedure Laterality Date  . Dilation and curettage of uterus    . Tubal ligation    . Nasal fracture surgery    . Cesarean section    . Colonoscopy    . Eye surgery      TEAR DUCT @ 6 MONTHS OLD    BP 116/67 mmHg  Pulse 84  Temp(Src) 97.7 F (36.5 C) (Oral)  Resp 18  Ht  (1.626 m)  Wt 75.297 kg (166 lb)  BMI 28.48 kg/m2  SpO2 100%  LMP 10/03/2015 (Approximate)  NAD RRR no murmurs CTAB, no wheezing, resps unlabored +BS, soft, NTTP No c/c/e Pelvic exam deferred  The above history was confirmed with the patient. The condition still exists that makes this procedure necessary. Surgical plan includes TOTAL LAPAROSCOPIC HYSTERECTOMY, BILATERAL SALPINGECTOMY as confirmed on the consent. The treatment plan remains the same, without new options for care.  The patient understands the potential benefits  and risks and the consents have been signed and placed on the chart.     Ranae Plumber, MD Attending Obstetrician Gynecologist [facility]

## 2015-10-13 NOTE — Anesthesia Procedure Notes (Signed)
Procedure Name: Intubation Date/Time: 10/13/2015 10:33 AM Performed by: Stormy Fabian Pre-anesthesia Checklist: Patient identified, Patient being monitored, Timeout performed, Emergency Drugs available and Suction available Patient Re-evaluated:Patient Re-evaluated prior to inductionOxygen Delivery Method: Circle system utilized Preoxygenation: Pre-oxygenation with 100% oxygen Intubation Type: IV induction Ventilation: Mask ventilation without difficulty Laryngoscope Size: Mac and 3 Grade View: Grade I Tube type: Oral Tube size: 7.0 mm Number of attempts: 1 Airway Equipment and Method: Stylet Placement Confirmation: ETT inserted through vocal cords under direct vision,  positive ETCO2 and breath sounds checked- equal and bilateral Secured at: 21 cm Tube secured with: Tape Dental Injury: Teeth and Oropharynx as per pre-operative assessment

## 2015-10-13 NOTE — Anesthesia Preprocedure Evaluation (Signed)
Anesthesia Evaluation  Patient identified by MRN, date of birth, ID band Patient awake    Airway Mallampati: II       Dental  (+) Teeth Intact   Pulmonary asthma , former smoker,    breath sounds clear to auscultation       Cardiovascular + dysrhythmias  Rhythm:Regular Rate:Normal     Neuro/Psych    GI/Hepatic negative GI ROS, Neg liver ROS,   Endo/Other  Hypothyroidism   Renal/GU negative Renal ROS     Musculoskeletal   Abdominal Normal abdominal exam  (+)   Peds  Hematology  (+) anemia ,   Anesthesia Other Findings   Reproductive/Obstetrics                             Anesthesia Physical Anesthesia Plan  ASA: II  Anesthesia Plan: General   Post-op Pain Management:    Induction: Intravenous  Airway Management Planned: Oral ETT  Additional Equipment:   Intra-op Plan:   Post-operative Plan:   Informed Consent: I have reviewed the patients History and Physical, chart, labs and discussed the procedure including the risks, benefits and alternatives for the proposed anesthesia with the patient or authorized representative who has indicated his/her understanding and acceptance.     Plan Discussed with: CRNA  Anesthesia Plan Comments:         Anesthesia Quick Evaluation

## 2015-10-13 NOTE — Progress Notes (Signed)
Nausea a little better  No vomiting

## 2015-10-13 NOTE — Op Note (Addendum)
Total Laparoscopic Hysterectomy Operative Note Procedure Date: 10/13/2015  Patient:  Lauren Little  40 y.o. female  PRE-OPERATIVE DIAGNOSIS:  ABNORMAL UTERINE BLEEDING  POST-OPERATIVE DIAGNOSIS:  abnormal uterine bleeding  PROCEDURE:  Procedure(s): HYSTERECTOMY TOTAL LAPAROSCOPIC (N/A) LAPAROSCOPIC BILATERAL SALPINGECTOMY (Bilateral)  SURGEON:  Surgeon(s) and Role:    * Chelsea C Ward, MD - Primary    * Nadara Mustard, MD - Assisting  ANESTHESIA:  General via ET  I/O  Total I/O In: 1350 [I.V.:1350] Out: 550 [Urine:500; Blood:50]  FINDINGS:  Small uterus, normal ovaries and previously ligated fallopian tubes bilaterally. Anterior omental adhesion surrounding umbilicus and descending into the pelvis.  Normal upper abdomen.  IUD strings were NOT visible at the start of the procedure.    SPECIMEN: Uterus, Cervix, and bilateral fallopian tubes  COMPLICATIONS: none apparent  DISPOSITION: vital signs stable to PACU  Indication for Surgery: 40 y.o. with debilitating menorrhagia, who has completed childbearing, and who has attempted several methods of hormonal manipulation for control of symptoms.  She has progestin IUD in place and is unsatisfied with results.  She has requested removal of uterus for definitive solution.  Risks of surgery were discussed with the patient including but not limited to: bleeding which may require transfusion or reoperation; infection which may require antibiotics; injury to bowel, bladder, ureters or other surrounding organs; need for additional procedures including laparotomy, blood clot, incisional problems and other postoperative/anesthesia complications. Written informed consent was obtained.      PROCEDURE IN DETAIL:  The patient had 5000u Heparin Sub-q and sequential compression devices applied to her lower extremities while in the preoperative area.  She was then taken to the operating room where general anesthesia was administered.  She was placed in  the dorsal lithotomy position, and was prepped and draped in a sterile manner. A surgical time-out was performed.  A Foley catheter was inserted into her bladder and attached to constant drainage and a V-Care uterine manipulator was then advanced into the uterus and a good fit around the cervix was noted. The gloves were changed, and attention was turned to the abdomen where an umbilical incision was made with the scalpel. A Hassan open technique was performed and the 15mm balloon trochar was advanced into the abdomen.  The abdomen was insufflated to Hg carbon dioxide gas and adequate pneumoperitoneum was obtained. A survey of the patient's pelvis and abdomen revealed the findings as mentioned above. The omental adhesion did not obstruct the view of the surgical field so it was left alone. Two 5mm ports were inserted in the lower left and right quadrants under visualization.    The bilateral fallopian tubes were separated from the mesosalpinx using the Ligasure. The bilateral round ligaments were transected and anterior broad ligament divided and brought across the uterus to separate the vesicouterine peritoneum and create a bladder flap. The bladder was pushed away from the uterus. The bilateral uterine arteries were ligated and transected. The bilateral uterosacral and cardinal ligaments were ligated and transected. A colpotomy was made around the V-Care cervical cup and the uterus, cervix, and bilateral tubes were removed through the vagina. The vaginal cuff was closed with the Endostitch and the V-lock in a running stitch. This was tested for integrity using the surgeon's finger. After a change of gloves, the pneumoperitoneum was recreated and surgical site inspected, and found to be hemostatic. Bilateral ureters were visualized vermiuclating. No intraoperative injury to surrounding organs was noted. The abdomen was desufflated and all instruments were then removed.  The fascia of the  umbilical incision was closed with 2-0 vicryl in a figure of eight, and tested for integrity.  All skin incisions were closed with 4-0 monocryl and covered with surgical glue. The patient tolerated the procedure well.  All instruments, needles, and sponge counts were correct x 2. The patient was taken to the recovery room in stable condition.   ---- Ranae Plumber, MD Attending Obstetrician and Gynecologist [facility]

## 2015-10-13 NOTE — Progress Notes (Signed)
zofran given for nausea  

## 2015-10-13 NOTE — Discharge Instructions (Signed)
Discharge instructions:  Call office if you have any of the following: fever >101 F, chills, excessive vaginal bleeding, incision drainage or problems, leg pain or redness, or any other concerns.   Activity: Do not lift > 10 lbs for 8 weeks.  No intercourse or tampons for 8 weeks.  No driving for 1-2 weeks.   Call your doctor for increased pain or vaginal bleeding, temperature above 101.0, or concerns. No strenuous activity or heavy lifting for 8 weeks. No intercourse, tampons, douching, or enemas for 8 weeks. No tub baths-showers only. No driving for 2 weeks or while taking pain medications.   Don't be a hero, but don't be a wimp either!    AMBULATORY SURGERY  DISCHARGE INSTRUCTIONS   1) The drugs that you were given will stay in your system until tomorrow so for the next 24 hours you should not:  A) Drive an automobile B) Make any legal decisions C) Drink any alcoholic beverage   2) You may resume regular meals tomorrow.  Today it is better to start with liquids and gradually work up to solid foods.  You may eat anything you prefer, but it is better to start with liquids, then soup and crackers, and gradually work up to solid foods.   3) Please notify your doctor immediately if you have any unusual bleeding, trouble breathing, redness and pain at the surgery site, drainage, fever, or pain not relieved by medication.    4) Additional Instructions:    Please contact your physician with any problems or Same Day Surgery at [phone], Monday through Friday 6 am to 4 pm, or [place] at [facility] number at [phone].

## 2015-10-13 NOTE — Transfer of Care (Signed)
Immediate Anesthesia Transfer of Care Note  Patient: Lauren Little  Procedure(s) Performed: Procedure(s): HYSTERECTOMY TOTAL LAPAROSCOPIC (N/A) LAPAROSCOPIC BILATERAL SALPINGECTOMY (Bilateral)  Patient Location: PACU  Anesthesia Type:General  Level of Consciousness: sedated  Airway & Oxygen Therapy: Patient Spontanous Breathing and Patient connected to face mask oxygen  Post-op Assessment: Report given to RN and Post -op Vital signs reviewed and stable  Post vital signs: Reviewed and stable  Last Vitals:  Filed Vitals:   10/13/15 0900 10/13/15 1223  BP: 116/67 117/59  Pulse: 84 81  Temp: 36.5 C 37.1 C  Resp: 18 18    Complications: No apparent anesthesia complications

## 2015-10-13 NOTE — Anesthesia Postprocedure Evaluation (Signed)
Anesthesia Post Note  Patient: Jonaya Freshour  Procedure(s) Performed: Procedure(s) (LRB): HYSTERECTOMY TOTAL LAPAROSCOPIC (N/A) LAPAROSCOPIC BILATERAL SALPINGECTOMY (Bilateral)  Patient location during evaluation: PACU Anesthesia Type: General Level of consciousness: awake and alert Pain management: satisfactory to patient Respiratory status: spontaneous breathing Cardiovascular status: stable Anesthetic complications: no    Last Vitals:  Filed Vitals:   10/13/15 1300 10/13/15 1310  BP: 106/56 111/57  Pulse: 62 73  Temp:    Resp: 12 12    Last Pain:  Filed Vitals:   10/13/15 1316  PainSc: 2                  VAN STAVEREN,Tashay Bozich

## 2015-10-14 ENCOUNTER — Encounter: Payer: Self-pay | Admitting: Obstetrics & Gynecology

## 2015-10-17 LAB — SURGICAL PATHOLOGY

## 2017-10-03 ENCOUNTER — Ambulatory Visit
Admission: EM | Admit: 2017-10-03 | Discharge: 2017-10-03 | Disposition: A | Discharge status: Home / Self Care | Payer: Managed Care, Other (non HMO) | Attending: Emergency Medicine | Admitting: Emergency Medicine

## 2017-10-03 ENCOUNTER — Other Ambulatory Visit: Payer: Self-pay | Admitting: Obstetrics & Gynecology

## 2017-10-03 ENCOUNTER — Other Ambulatory Visit: Payer: Self-pay

## 2017-10-03 DIAGNOSIS — R5383 Other fatigue: Secondary | ICD-10-CM | POA: Diagnosis not present

## 2017-10-03 DIAGNOSIS — R51 Headache: Secondary | ICD-10-CM

## 2017-10-03 DIAGNOSIS — J029 Acute pharyngitis, unspecified: Secondary | ICD-10-CM | POA: Diagnosis not present

## 2017-10-03 DIAGNOSIS — Z1239 Encounter for other screening for malignant neoplasm of breast: Secondary | ICD-10-CM

## 2017-10-03 LAB — RAPID STREP SCREEN (MED CTR MEBANE ONLY): STREPTOCOCCUS, GROUP A SCREEN (DIRECT): NEGATIVE

## 2017-10-03 MED ORDER — IBUPROFEN 600 MG PO TABS: 600.0000 mg | ORAL_TABLET | Freq: Four times a day (QID) | ORAL | 0 refills | Status: AC | PRN

## 2017-10-03 MED ORDER — MAGIC MOUTHWASH W/LIDOCAINE: 10.0000 mL | Freq: Three times a day (TID) | ORAL | 0 refills | Status: DC | PRN

## 2017-10-03 NOTE — Discharge Instructions (Signed)
your rapid strep was negative today, so we have sent off a throat culture.  We will contact you and call in the appropriate antibiotics if your culture comes back positive for an infection requiring antibiotic treatment.  Give us a working phone number.  If you were given a prescription for antibiotics, you may want to wait and fill it until you know the results of the culture.  1 gram of Tylenol and 600 mg ibuprofen together 3-4 times a day as needed for pain.  Make sure you drink plenty of extra fluids.  Some people find salt water gargles and  Traditional Medicinal's "Throat Coat" tea helpful. Take 5 mL of liquid Benadryl and 5 mL of Maalox. Mix it together, and then hold it in your mouth for as long as you can and then swallow. You may do this 4 times a day.  Try an antihistamine decongestant combination such as Claritin-D, Allegra-D, or Zyrtec-D, and retry the Flonase.  Here is a list of primary care providers who are taking new patients:  Dr. Elizabeth Sauereanna Jones, Dr. Schuyler AmorWilliam Plonk [address] [place] [address] [phone]  [facility] [place] [address]  [place] [address]  [phone]  [facility] [address]  [place], [place] [address] [phone]  [facility] [place] [address]  [phone] [place], [place] [address]  Here are clinics/ other resources who will see you if you do not have insurance. Some have certain criteria that you must meet. Call them and find out what they are:  [facility]: [address] [place] [address] [place], [place] [address] Phone: [phone] Hours: First and Third Saturdays of each Month, 9 a.m. - 1 p.m.  [facility]: [address], [place], [place] [address] Phone: [phone] Hours: Tuesday, 4 p.m. - 8 p.m. Thursday, 1 p.m. - 8 p.m. Wednesday, 9 a.m. - [facility]  [facility] [address], [place], [place] [address] Phone: [phone] Pharmacy Phone Number:  [phone] Dental Phone Number: [phone] [facility] Insurance Help: [phone]  Dental Hours: Monday - Thursday, 8 a.m. - 6 p.m.  [facility] [place] [facility] [address] [place], [place] [address] Phone: [phone] Pharmacy Phone Number: [phone] [facility] Insurance Help: [phone]  [facility] [address] [place], [place] [address] Phone: [phone] Pharmacy Phone Number: [phone] [facility] Insurance Help: [phone]  [facility] [address] [place], [place] [address] Phone: [phone] [facility] Insurance Help: [phone]   [facility] [address] [place], [place] [address] Phone: [phone]  Go to www.goodrx.com to look up your medications. This will give you a list of where you can find your prescriptions at the most affordable prices. Or ask the pharmacist what the cash price is, or if they have any other discount programs available to help make your medication more affordable. This can be less expensive than what you would pay with insurance.

## 2017-10-03 NOTE — ED Triage Notes (Signed)
Patient complains of sore throat, ear pain, neck pain, fever-intermittent. Patient states that her symptoms started 6 days ago.

## 2017-10-03 NOTE — ED Provider Notes (Signed)
HPI  SUBJECTIVE:  Lauren Little is a 42 y.o. female who presents with 6 days of sore throat, stopped up ears, fevers T-max 100.3.  She reports fatigue, headache, occasional dry cough.  Reports ear pain on the right more so than the left.  She reports neck pain 2 days ago, states that this has since resolved.  She denies otorrhea, change in hearing.  No nasal congestion, rhinorrhea, sinus pain or pressure, postnasal drip.  No body aches, abdominal pain, rash, drooling, trismus.  No muffled, hot potato voice.  No cervical lymph nodes at home.  No allergy or GERD symptoms.  No contacts with mono or strep.  States that her husband had the flu last week, she states that this does not feel like the flu.  No antibiotics in the past month.  She took Tylenol within 6-8 hours of evaluation.  She tried Sudafed, Flonase and Tylenol.  The Tylenol helped with the sore throat.  Symptoms are worse with swallowing.  She has a past medical history of mono in high school, hypothyroidism.  She denies diabetes, hypertension, chronic kidney disease.  LMP: Hysterectomy.  PMD: None.   Past Medical History:  Diagnosis Date  . Anemia    H/O  . Chronic kidney disease 2008   H/O KIDNEY STONES   . Complication of anesthesia    TAKES MORE TO GET PT SEDATED  . Dysrhythmia    H/O TACHYCARDIA IN 2015 DURING PREGNANCY-SAW DR Alcide Clever AND HAD ECHO DONE (NORMAL) AND HOLTER MONITOR SHOWED PVC'S-NO PROBLEMS SINCE HER PREGNANCY (10-10-15)  . Family history of adverse reaction to anesthesia    DAD-UNSURE OF WHAT REACTION HE HAD TO ANESTHESIA  . Headache    MIGRAINES  . Hypothyroidism     Past Surgical History:  Procedure Laterality Date  . CESAREAN SECTION    . COLONOSCOPY    . DILATION AND CURETTAGE OF UTERUS    . EYE SURGERY     TEAR DUCT @ 6 MONTHS OLD  . LAPAROSCOPIC BILATERAL SALPINGECTOMY Bilateral 10/13/2015   Procedure: LAPAROSCOPIC BILATERAL SALPINGECTOMY;  Surgeon: Elenora Fender Ward, MD;  Location: [facility];  Service:  Gynecology;  Laterality: Bilateral;  . LAPAROSCOPIC HYSTERECTOMY N/A 10/13/2015   Procedure: HYSTERECTOMY TOTAL LAPAROSCOPIC;  Surgeon: Elenora Fender Ward, MD;  Location: [facility];  Service: Gynecology;  Laterality: N/A;  . NASAL FRACTURE SURGERY    . TUBAL LIGATION      Family History  Problem Relation Age of Onset  . Diabetes Mother   . Heart disease Mother   . Hyperlipidemia Mother     Social History   Tobacco Use  . Smoking status: Former Smoker    Types: Cigarettes    Last attempt to quit: 10/09/2006    Years since quitting: 10.9  . Smokeless tobacco: Never Used  Substance Use Topics  . Alcohol use: Yes    Comment: occasionally  . Drug use: No    No current facility-administered medications for this encounter.   Current Outpatient Medications:  .  levothyroxine (SYNTHROID, LEVOTHROID) 88 MCG tablet, Take 88 mcg by mouth daily before breakfast., Disp: , Rfl:  .  ibuprofen (ADVIL,MOTRIN) 600 MG tablet, Take 1 tablet (600 mg total) by mouth every 6 (six) hours as needed., Disp: 30 tablet, Rfl: 0 .  magic mouthwash w/lidocaine SOLN, Take 10 mLs by mouth 3 (three) times daily as needed for mouth pain. Swish and spit do not swallow, Disp: 200 mL, Rfl: 0  Allergies  Allergen Reactions  . Bee  Venom Shortness Of Breath  . Sulfa Antibiotics Hives and Shortness Of Breath  . Tramadol Shortness Of Breath  . Cefdinir Rash and Other (See Comments)    BLOATING     ROS  As noted in HPI.   Physical Exam  BP 122/70 (BP Location: Left Arm)   Pulse 79   Temp 98.3 F (36.8 C) (Oral)   Resp 18   Ht 5\' 4"  (1.626 m)   Wt 175 lb (79.4 kg)   LMP 10/03/2015 (Approximate)   SpO2 99%   BMI 30.04 kg/m   Constitutional: Well developed, well nourished, no acute distress Eyes:  EOMI, conjunctiva normal bilaterally HENT: Normocephalic, atraumatic,mucus membranes moist.  No nasal congestion.  No sinus tenderness.  TMs normal bilaterally.  Erythematous oropharynx.  Normal tonsils.  No  swelling, exudates.  Positive cobblestoning.  No appreciable postnasal drip.  Uvula midline. Neck: Positive cervical lymphadenopathy. Respiratory: Normal inspiratory effort, lungs clear bilaterally cardiovascular: Normal rate regular rhythm, no murmurs, rubs, gallops  GI: nondistended, soft, no splenomegaly. skin: No rash, skin intact Musculoskeletal: no deformities Neurologic: Alert & oriented x 3, no focal neuro deficits Psychiatric: Speech and behavior appropriate   ED Course   Medications - No data to display  Orders Placed This Encounter  Procedures  . Rapid strep screen    Standing Status:   Standing    Number of Occurrences:   1    Order Specific Question:   Patient immune status    Answer:   Normal  . Culture, group A strep    Standing Status:   Standing    Number of Occurrences:   1    Results for orders placed or performed during the hospital encounter of 10/03/17 (from the past 24 hour(s))  Rapid strep screen     Status: None   Collection Time: 10/03/17  5:01 PM  Result Value Ref Range   Streptococcus, Group A Screen (Direct) NEGATIVE NEGATIVE   No results found.  ED Clinical Impression  Sore throat   ED Assessment/Plan  Rapid strep negative.  Presentation most consistent with a viral pharyngitis, she does not have swollen tonsils or exudates, so while mono is in the differential, feel that this is less likely.  She will return here in 4 days if she is still having a sore throat and then we can reconsider testing for mono at that time.  Doubt GERD causing her sore throat with the cervical lymphadenopathy.  Will send home with Magic mouthwash or she can use Benadryl/Maalox mixture, advised Claritin-D, Zyrtec-D, or Allegra-D, ibuprofen 600 mg take with 1 g of Tylenol, Flonase as tolerated.  Will provide a primary care referral list and order primary care referral for routine care.   Discussed labs,MDM, plan and followup with patient. Discussed sn/sx that should  prompt return to the ED. patient agrees with plan.   Meds ordered this encounter  Medications  . ibuprofen (ADVIL,MOTRIN) 600 MG tablet    Sig: Take 1 tablet (600 mg total) by mouth every 6 (six) hours as needed.    Dispense:  30 tablet    Refill:  0  . magic mouthwash w/lidocaine SOLN    Sig: Take 10 mLs by mouth 3 (three) times daily as needed for mouth pain. Swish and spit do not swallow    Dispense:  200 mL    Refill:  0    *This clinic note was created using Scientist, clinical (histocompatibility and immunogenetics)Dragon dictation software. Therefore, there may be occasional mistakes despite careful proofreading.   ?  Domenick Gong, MD 10/03/17 [id number]

## 2017-10-06 LAB — CULTURE, GROUP A STREP (THRC)

## 2017-10-17 ENCOUNTER — Ambulatory Visit
Admission: RE | Admit: 2017-10-17 | Discharge: 2017-10-17 | Disposition: A | Discharge status: Home / Self Care | Payer: Managed Care, Other (non HMO) | Source: Ambulatory Visit | Attending: Obstetrics & Gynecology | Admitting: Obstetrics & Gynecology

## 2017-10-17 DIAGNOSIS — Z1231 Encounter for screening mammogram for malignant neoplasm of breast: Secondary | ICD-10-CM | POA: Diagnosis present

## 2017-10-17 DIAGNOSIS — Z1239 Encounter for other screening for malignant neoplasm of breast: Secondary | ICD-10-CM

## 2018-07-21 ENCOUNTER — Ambulatory Visit
Admission: EM | Admit: 2018-07-21 | Discharge: 2018-07-21 | Disposition: A | Discharge status: Home / Self Care | Payer: Managed Care, Other (non HMO) | Attending: Family Medicine | Admitting: Family Medicine

## 2018-07-21 ENCOUNTER — Other Ambulatory Visit: Payer: Self-pay

## 2018-07-21 ENCOUNTER — Encounter: Payer: Self-pay | Admitting: Emergency Medicine

## 2018-07-21 DIAGNOSIS — B349 Viral infection, unspecified: Secondary | ICD-10-CM

## 2018-07-21 LAB — RAPID INFLUENZA A&B ANTIGENS: Influenza B (ARMC): NEGATIVE

## 2018-07-21 LAB — RAPID INFLUENZA A&B ANTIGENS (ARMC ONLY): INFLUENZA A (ARMC): NEGATIVE

## 2018-07-21 MED ORDER — HYDROCOD POLST-CPM POLST ER 10-8 MG/5ML PO SUER: 5.0000 mL | Freq: Every evening | ORAL | 0 refills | Status: AC | PRN

## 2018-07-21 NOTE — ED Provider Notes (Signed)
[facility] ____________________________________________  Time seen: Approximately 11:42 AM  I have reviewed the triage vital signs and the nursing notes.   HISTORY  Chief Complaint Cough and Fever  HPI Lauren Little is a 42 y.o. female presenting for evaluation of 3 days of runny nose, nasal congestion, cough, sore throat and fever.  Reports T-max 101.5 yesterday.  Has been taking intermittent over-the-counter ibuprofen, Robitussin, Sudafed with some improvement, but no resolution.  States cough is disrupting sleep.  Cough occasionally productive, also dry and hacky.  Reports her daughter recently had some similar complaints but is very quick and unsure if it was a vaccine related.  Denies sore throat feeling like previous strep throat.  Overall continues to drink fluids well.  States feels achy and tired.  Denies other aggravating alleviating factors.  Denies of having chest pain, shortness of breath or abdominal pain.  Denies recent sickness.  Reports otherwise doing well.     Past Medical History:  Diagnosis Date  . Anemia    H/O  . Chronic kidney disease 2008   H/O KIDNEY STONES   . Complication of anesthesia    TAKES MORE TO GET PT SEDATED  . Dysrhythmia    H/O TACHYCARDIA IN 2015 DURING PREGNANCY-SAW DR Alcide Clever AND HAD ECHO DONE (NORMAL) AND HOLTER MONITOR SHOWED PVC'S-NO PROBLEMS SINCE HER PREGNANCY (10-10-15)  . Family history of adverse reaction to anesthesia    DAD-UNSURE OF WHAT REACTION HE HAD TO ANESTHESIA  . Headache    MIGRAINES  . Hypothyroidism     There are no active problems to display for this patient.   Past Surgical History:  Procedure Laterality Date  . ABDOMINAL HYSTERECTOMY    . CESAREAN SECTION    . COLONOSCOPY    . DILATION AND CURETTAGE OF UTERUS    . EYE SURGERY     TEAR DUCT @ 6 MONTHS OLD  . LAPAROSCOPIC BILATERAL SALPINGECTOMY Bilateral 10/13/2015   Procedure: LAPAROSCOPIC BILATERAL SALPINGECTOMY;  Surgeon: Elenora Fender Ward, MD;   Location: [facility];  Service: Gynecology;  Laterality: Bilateral;  . LAPAROSCOPIC HYSTERECTOMY N/A 10/13/2015   Procedure: HYSTERECTOMY TOTAL LAPAROSCOPIC;  Surgeon: Elenora Fender Ward, MD;  Location: [facility];  Service: Gynecology;  Laterality: N/A;  . NASAL FRACTURE SURGERY    . TUBAL LIGATION       No current facility-administered medications for this encounter.   Current Outpatient Medications:  .  ibuprofen (ADVIL,MOTRIN) 600 MG tablet, Take 1 tablet (600 mg total) by mouth every 6 (six) hours as needed., Disp: 30 tablet, Rfl: 0 .  levothyroxine (SYNTHROID, LEVOTHROID) 88 MCG tablet, Take 88 mcg by mouth daily before breakfast., Disp: , Rfl:  .  chlorpheniramine-HYDROcodone (TUSSIONEX PENNKINETIC ER) 10-8 MG/5ML SUER, Take 5 mLs by mouth at bedtime as needed for cough. do not drive or operate machinery while taking as can cause drowsiness., Disp: 50 mL, Rfl: 0  Allergies Bee venom; Sulfa antibiotics; Tramadol; and Cefdinir  Family History  Problem Relation Age of Onset  . Diabetes Mother   . Heart disease Mother   . Hyperlipidemia Mother   . Breast cancer Paternal Grandmother     Social History Social History   Tobacco Use  . Smoking status: Former Smoker    Types: Cigarettes    Last attempt to quit: 10/09/2006    Years since quitting: 11.7  . Smokeless tobacco: Never Used  Substance Use Topics  . Alcohol use: Yes    Comment: occasionally  . Drug use: No  Review of Systems Constitutional: Positive fevers. ENT: As above Cardiovascular: Denies chest pain. Respiratory: Denies shortness of breath. Gastrointestinal: No abdominal pain.   Musculoskeletal: Negative for back pain. Skin: Negative for rash.   ____________________________________________   PHYSICAL EXAM:  VITAL SIGNS: ED Triage Vitals  Enc Vitals Group     BP 07/21/18 0954 112/70     Pulse Rate 07/21/18 0954 94     Resp 07/21/18 0954 17     Temp 07/21/18 0954 99 F (37.2 C)     Temp Source 07/21/18  0954 Oral     SpO2 07/21/18 0954 99 %     Weight 07/21/18 0951 175 lb (79.4 kg)     Height 07/21/18 0951 5\' 4"  (1.626 m)     Head Circumference --      Peak Flow --      Pain Score 07/21/18 0951 5     Pain Loc --      Pain Edu? --      Excl. in GC? --     Constitutional: Alert and oriented. Well appearing and in no acute distress. Eyes: Conjunctivae are normal.  Head: Atraumatic. No sinus tenderness to palpation. No swelling. No erythema.  Ears: no erythema, normal TMs bilaterally.   Nose:Nasal congestion   Mouth/Throat: Mucous membranes are moist. Mild pharyngeal erythema. No tonsillar swelling or exudate.  Neck: No stridor.  No cervical spine tenderness to palpation. Hematological/Lymphatic/Immunilogical: No cervical lymphadenopathy. Cardiovascular: Normal rate, regular rhythm. Grossly normal heart sounds.  Good peripheral circulation. Respiratory: Normal respiratory effort.  No retractions. No wheezes, rales or rhonchi. Good air movement.  Musculoskeletal: Ambulatory with steady gait.  Neurologic:  Normal speech and language. No gait instability. Skin:  Skin appears warm, dry and intact. No rash noted. Psychiatric: Mood and affect are normal. Speech and behavior are normal.  ___________________________________________   LABS (all labs ordered are listed, but only abnormal results are displayed)  Labs Reviewed  RAPID INFLUENZA A&B ANTIGENS ([facility] ONLY)     PROCEDURES Procedures    INITIAL IMPRESSION / ASSESSMENT AND PLAN / ED COURSE  Pertinent labs & imaging results that were available during my care of the patient were reviewed by me and considered in my medical decision making (see chart for details).  Overall well-appearing patient.  No acute distress.  Flu test negative.  Patient declined strep evaluation.  Suspect viral illness.  Encourage rest, fluids, supportive care, continue over-the-counter cough and congestion medication.  Will Rx PRN Tussionex.  Work note  given for today and tomorrow. Discussed indication, risks and benefits of medications with patient.  Discussed follow up with Primary care physician this week. Discussed follow up and return parameters including no resolution or any worsening concerns. Patient verbalized understanding and agreed to plan.   ____________________________________________   FINAL CLINICAL IMPRESSION(S) / ED DIAGNOSES  Final diagnoses:  Viral illness     ED Discharge Orders         Ordered    chlorpheniramine-HYDROcodone (TUSSIONEX PENNKINETIC ER) 10-8 MG/5ML SUER  At bedtime PRN     07/21/18 1115           Note: This dictation was prepared with Dragon dictation along with smaller phrase technology. Any transcriptional errors that result from this process are unintentional.         Renford Dills, NP 07/21/18 1210

## 2018-07-21 NOTE — ED Triage Notes (Signed)
Pt c/o cough, flushing feeling, fever (100.8-101.0), congestion, sore throat, body aches, and right ear, hoarseness. Started about 3 days ago. She has been taking sudafed, Robitussin and ibuprofen

## 2018-07-21 NOTE — Discharge Instructions (Addendum)
Take medication as prescribed. Rest. Drink plenty of fluids.  ° °Follow up with your primary care physician this week as needed. Return to Urgent care for new or worsening concerns.  ° °

## 2018-11-20 ENCOUNTER — Other Ambulatory Visit: Payer: Self-pay | Admitting: Obstetrics & Gynecology

## 2018-11-20 DIAGNOSIS — Z1231 Encounter for screening mammogram for malignant neoplasm of breast: Secondary | ICD-10-CM

## 2019-06-30 ENCOUNTER — Other Ambulatory Visit: Payer: Self-pay | Admitting: Obstetrics & Gynecology

## 2019-06-30 DIAGNOSIS — N644 Mastodynia: Secondary | ICD-10-CM

## 2019-07-07 ENCOUNTER — Ambulatory Visit
Admission: RE | Admit: 2019-07-07 | Discharge: 2019-07-07 | Disposition: A | Discharge status: Home / Self Care | Payer: Managed Care, Other (non HMO) | Source: Ambulatory Visit | Attending: Obstetrics & Gynecology | Admitting: Obstetrics & Gynecology

## 2019-07-07 DIAGNOSIS — N644 Mastodynia: Secondary | ICD-10-CM

## 2020-06-23 ENCOUNTER — Other Ambulatory Visit: Payer: Self-pay | Admitting: Obstetrics & Gynecology

## 2020-06-23 DIAGNOSIS — Z1231 Encounter for screening mammogram for malignant neoplasm of breast: Secondary | ICD-10-CM

## 2020-08-02 ENCOUNTER — Other Ambulatory Visit: Payer: Self-pay | Admitting: Obstetrics & Gynecology

## 2020-08-02 DIAGNOSIS — Z1231 Encounter for screening mammogram for malignant neoplasm of breast: Secondary | ICD-10-CM

## 2020-08-23 ENCOUNTER — Ambulatory Visit
Admission: RE | Admit: 2020-08-23 | Discharge: 2020-08-23 | Disposition: A | Discharge status: Home / Self Care | Payer: Managed Care, Other (non HMO) | Source: Ambulatory Visit | Attending: Obstetrics & Gynecology | Admitting: Obstetrics & Gynecology

## 2020-08-23 ENCOUNTER — Other Ambulatory Visit: Payer: Self-pay

## 2020-08-23 DIAGNOSIS — Z1231 Encounter for screening mammogram for malignant neoplasm of breast: Secondary | ICD-10-CM | POA: Diagnosis not present

## 2022-04-26 IMAGING — MG DIGITAL SCREENING BILAT W/ TOMO W/ CAD
8 series · 8 of 24 positions shown · non-contrast
Comparison: Previous exam(s).

CLINICAL DATA: Screening.

EXAM:
DIGITAL SCREENING BILATERAL MAMMOGRAM WITH TOMO AND CAD

[L CC synth-2D]
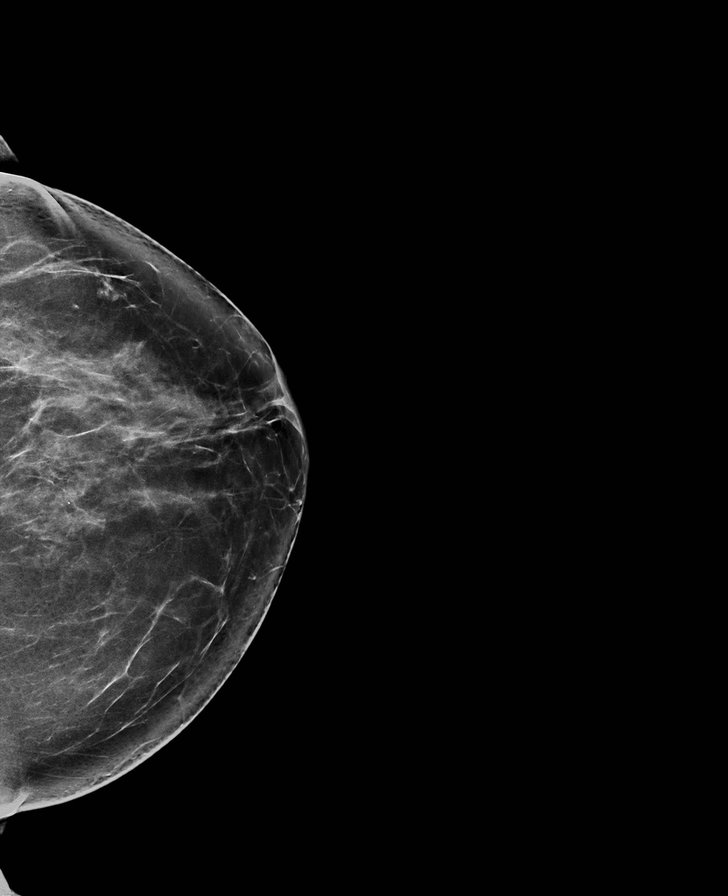

[L MLO synth-2D]
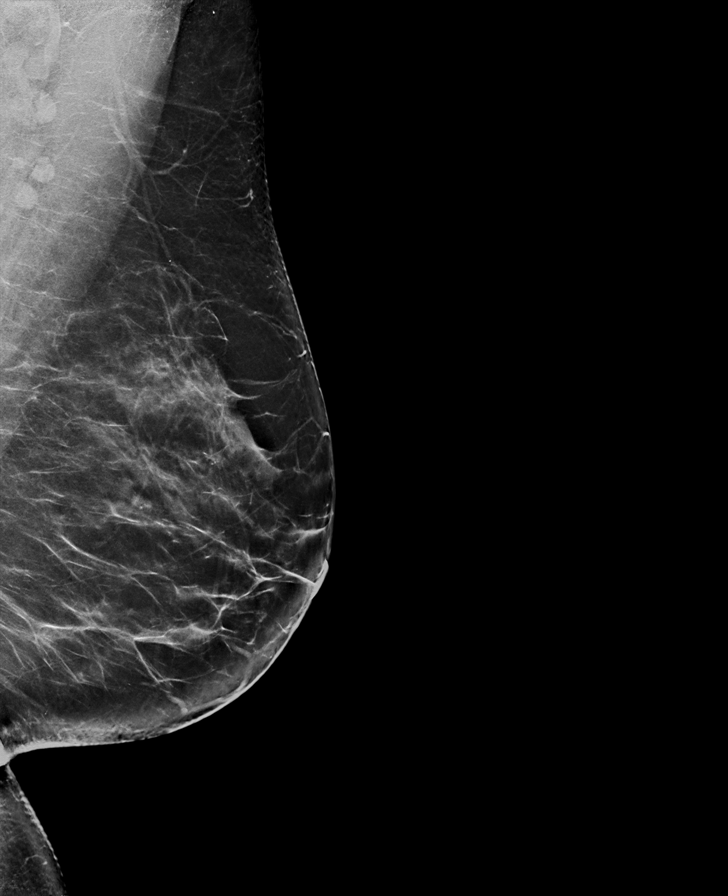

[R CC synth-2D]
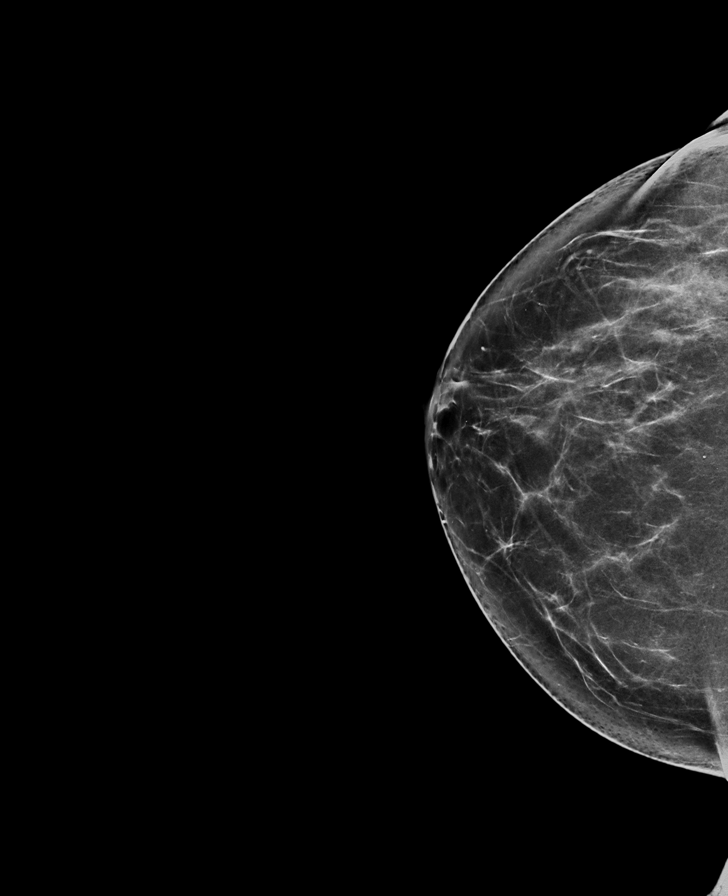

[R MLO synth-2D]
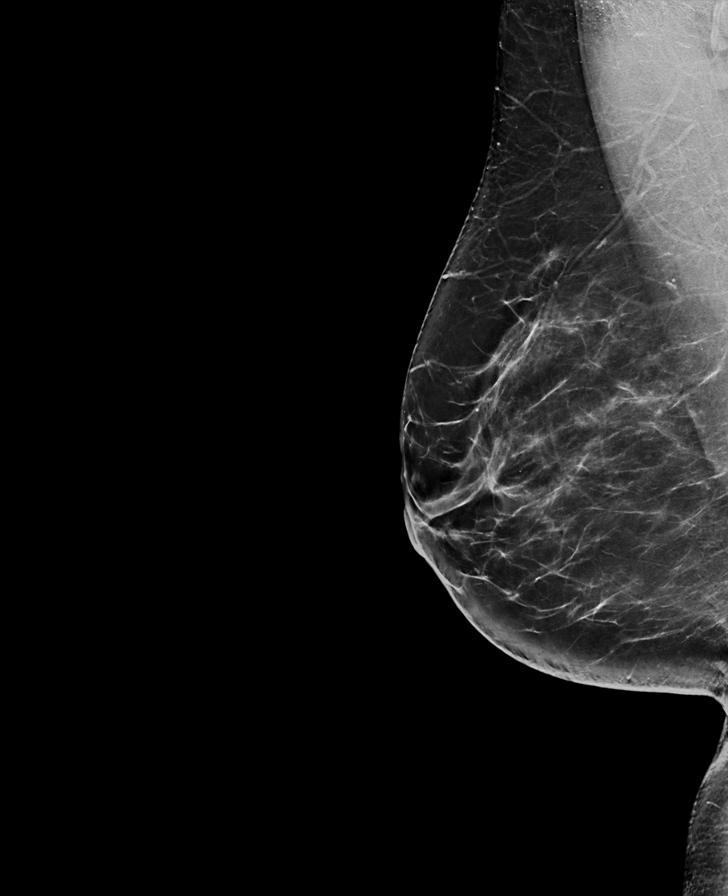

[R CC tomo · tomo slice 39/77.0]
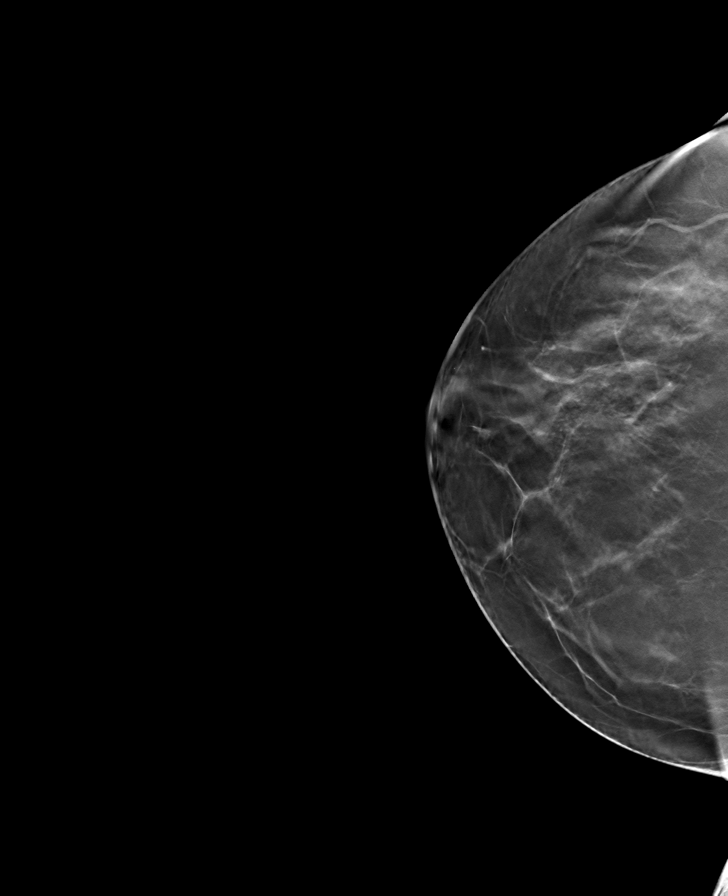

[R MLO tomo · tomo slice 43/84.0]
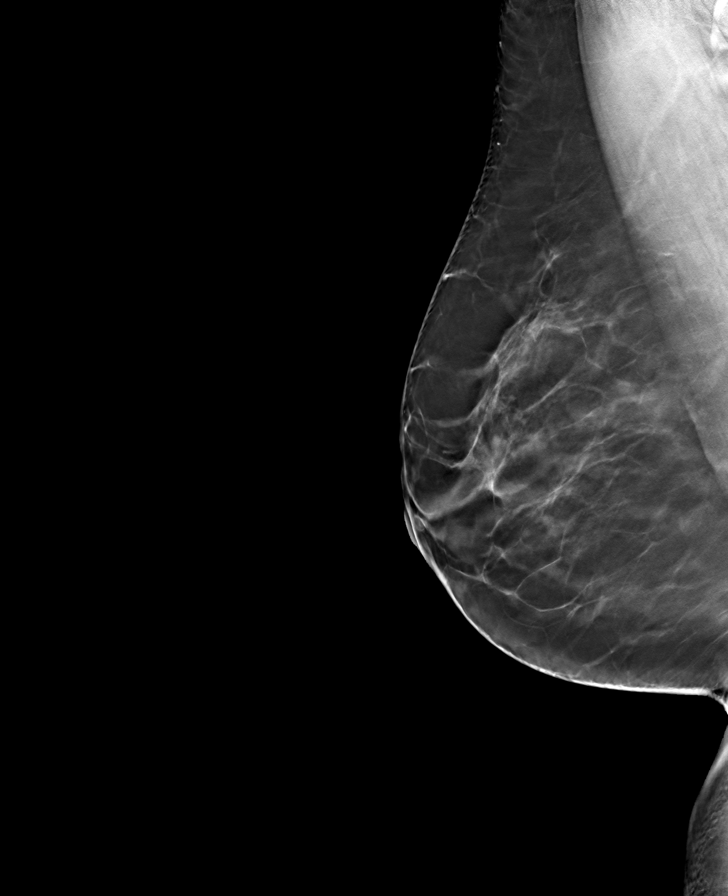

[L MLO tomo · tomo slice 43/86.0]
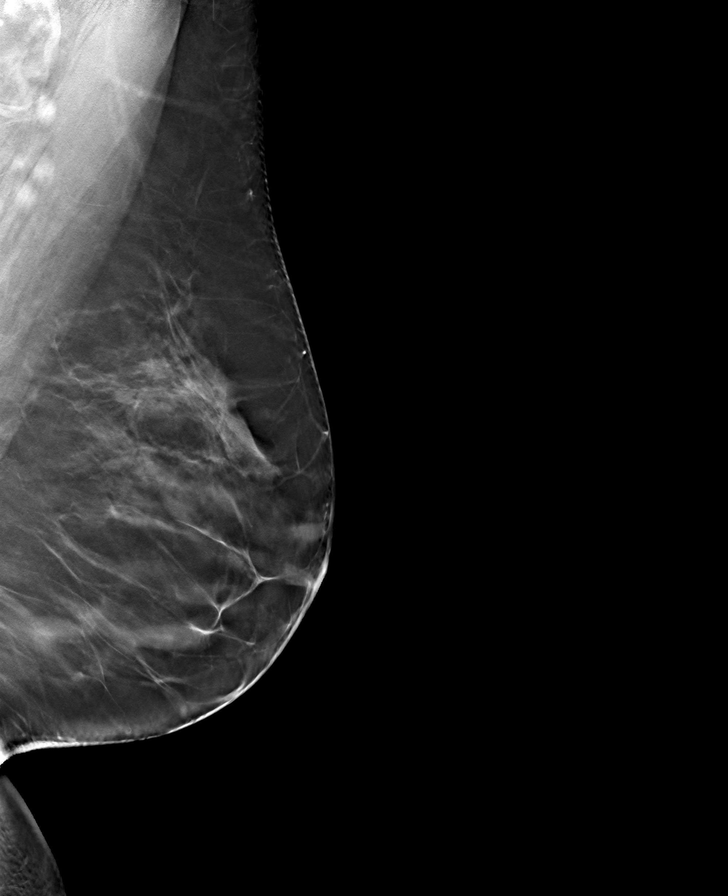

[L CC tomo · tomo slice 41/82.0]
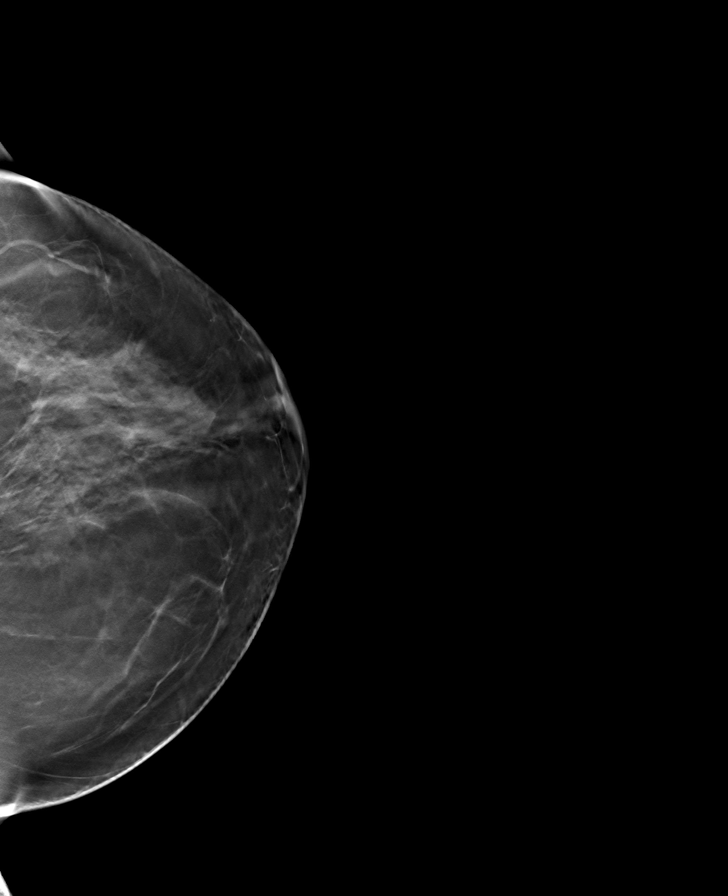

[8 of 24 positions shown; findings below may reference images not displayed]

ACR Breast Density Category c: The breast tissue is heterogeneously
dense, which may obscure small masses.
FINDINGS: There are no findings suspicious for malignancy. Images were
processed with CAD.
IMPRESSION: No mammographic evidence of malignancy. A result letter of this
screening mammogram will be mailed directly to the patient.

RECOMMENDATION:
Screening mammogram in one year. (Code:[id number]-U-LHB)

BI-RADS CATEGORY  1: Negative.
